# Patient Record
Sex: Female | Born: 1950
Health system: Southern US, Community
[De-identification: ages and names within clinical notes are randomized; demographics above are authoritative.]

## PROBLEM LIST (undated history)

## (undated) DIAGNOSIS — E785 Hyperlipidemia, unspecified: Secondary | ICD-10-CM

## (undated) DIAGNOSIS — N390 Urinary tract infection, site not specified: Secondary | ICD-10-CM

## (undated) DIAGNOSIS — F32A Depression, unspecified: Secondary | ICD-10-CM

## (undated) DIAGNOSIS — E039 Hypothyroidism, unspecified: Secondary | ICD-10-CM

## (undated) DIAGNOSIS — K219 Gastro-esophageal reflux disease without esophagitis: Secondary | ICD-10-CM

## (undated) HISTORY — DX: Hyperlipidemia, unspecified: E78.5

## (undated) HISTORY — PX: HEMORRHOID SURGERY: SHX153

## (undated) HISTORY — DX: Urinary tract infection, site not specified: N39.0

---

## 1998-10-20 ENCOUNTER — Other Ambulatory Visit: Admission: RE | Admit: 1998-10-20 | Discharge: 1998-10-20 | Payer: Self-pay | Admitting: Obstetrics and Gynecology

## 2000-08-13 ENCOUNTER — Other Ambulatory Visit: Admission: RE | Admit: 2000-08-13 | Discharge: 2000-08-13 | Payer: Self-pay | Admitting: Obstetrics and Gynecology

## 2002-06-05 HISTORY — PX: COLONOSCOPY: SHX174

## 2002-06-30 ENCOUNTER — Ambulatory Visit (HOSPITAL_COMMUNITY): Admission: RE | Admit: 2002-06-30 | Discharge: 2002-06-30 | Payer: Self-pay | Admitting: General Surgery

## 2003-09-02 ENCOUNTER — Ambulatory Visit (HOSPITAL_COMMUNITY): Admission: RE | Admit: 2003-09-02 | Discharge: 2003-09-02 | Payer: Self-pay | Admitting: Family Medicine

## 2004-10-09 ENCOUNTER — Ambulatory Visit (HOSPITAL_COMMUNITY): Admission: RE | Admit: 2004-10-09 | Discharge: 2004-10-09 | Payer: Self-pay | Admitting: Family Medicine

## 2005-05-13 ENCOUNTER — Ambulatory Visit (HOSPITAL_COMMUNITY): Admission: RE | Admit: 2005-05-13 | Discharge: 2005-05-13 | Payer: Self-pay | Admitting: Urology

## 2005-11-01 ENCOUNTER — Ambulatory Visit (HOSPITAL_COMMUNITY): Admission: RE | Admit: 2005-11-01 | Discharge: 2005-11-01 | Payer: Self-pay | Admitting: Family Medicine

## 2006-09-23 ENCOUNTER — Ambulatory Visit (HOSPITAL_COMMUNITY): Admission: RE | Admit: 2006-09-23 | Discharge: 2006-09-23 | Payer: Self-pay | Admitting: Family Medicine

## 2006-11-20 ENCOUNTER — Ambulatory Visit (HOSPITAL_COMMUNITY): Admission: RE | Admit: 2006-11-20 | Discharge: 2006-11-20 | Payer: Self-pay | Admitting: Family Medicine

## 2007-09-29 ENCOUNTER — Ambulatory Visit (HOSPITAL_COMMUNITY): Admission: RE | Admit: 2007-09-29 | Discharge: 2007-09-29 | Payer: Self-pay | Admitting: Family Medicine

## 2008-08-04 ENCOUNTER — Ambulatory Visit (HOSPITAL_COMMUNITY): Admission: RE | Admit: 2008-08-04 | Discharge: 2008-08-04 | Payer: Self-pay | Admitting: Family Medicine

## 2010-01-11 ENCOUNTER — Ambulatory Visit (HOSPITAL_COMMUNITY): Admission: RE | Admit: 2010-01-11 | Discharge: 2010-01-11 | Payer: Self-pay | Admitting: Family Medicine

## 2010-08-25 ENCOUNTER — Encounter: Payer: Self-pay | Admitting: Family Medicine

## 2010-08-26 ENCOUNTER — Encounter: Payer: Self-pay | Admitting: Family Medicine

## 2010-12-21 NOTE — H&P (Signed)
   April Cobb, UYENO NO.:  [id number]   MEDICAL RECORD NO.:  [id number]                  PATIENT TYPE:   LOCATION:                                       FACILITY:   PHYSICIAN:  Dalia Heading, M.D.               DATE OF BIRTH:  [birth year]   DATE OF ADMISSION:  DATE OF DISCHARGE:                                HISTORY & PHYSICAL   CHIEF COMPLAINT:  Hematochezia.   HISTORY OF PRESENT ILLNESS:  The patient is a 60 year old white female who  was referred for colonoscopy.  She has been having intermittent hematochezia  for many months.  She recently started having hemorrhoidal problems and  itching.  She has had a hemorrhoidectomy in the remote past.  She denies any  lightheadedness, weight loss, fever, abdominal pain, constipation, diarrhea,  or melena.  She has never had a colonoscopy.  She denies any immediate  family history of colon carcinoma.   PAST MEDICAL HISTORY:  Unremarkable.   PAST SURGICAL HISTORY:  Hemorrhoidectomy.   CURRENT MEDICATIONS:  1. Anusol suppositories.  2. Premarin vaginal cream.   ALLERGIES:  DARVOCET and CODEINE.   REVIEW OF SYMPTOMS:  The patient does smoke a half of a packs of cigarettes  a day.  She denies any significant alcohol use.  She denies any other  cardiopulmonary difficulty or bleeding disorders.   PHYSICAL EXAMINATION:  GENERAL APPEARANCE:  The patient is a well-developed,  well-nourished, white female in no acute distress.  VITAL SIGNS:  She is afebrile and vital signs are stable.  LUNGS:  Clear to auscultation with equal breath sounds bilaterally.  HEART:  Regular rate and rhythm without S3, S4, or murmurs.  ABDOMEN:  Soft, nontender, and nondistended.  No hepatosplenomegaly or  masses noted.  RECTAL:  Examination deferred to the procedure.   IMPRESSION:  Hematochezia.    PLAN:  The patient is scheduled for a colonoscopy on June 30, 2002.  The  risks and benefits of the procedure,  including bleeding and perforation were  full explained to the patient, who gave informed consent.                                               Dalia Heading, M.D.    MAJ/MEDQ  D:  06/24/2002  T:  06/24/2002  Job:  [id number]   cc:   Patrica Duel, M.D.  [address], [address]  [place]  [place] [address]  Fax: [phone]

## 2011-03-19 ENCOUNTER — Other Ambulatory Visit (HOSPITAL_COMMUNITY): Payer: Self-pay | Admitting: Family Medicine

## 2011-03-19 DIAGNOSIS — Z139 Encounter for screening, unspecified: Secondary | ICD-10-CM

## 2011-03-22 ENCOUNTER — Ambulatory Visit (HOSPITAL_COMMUNITY)
Admission: RE | Admit: 2011-03-22 | Discharge: 2011-03-22 | Disposition: A | Discharge status: Home / Self Care | Payer: BC Managed Care – PPO | Source: Ambulatory Visit | Attending: Family Medicine | Admitting: Family Medicine

## 2011-03-22 DIAGNOSIS — Z1231 Encounter for screening mammogram for malignant neoplasm of breast: Secondary | ICD-10-CM | POA: Insufficient documentation

## 2011-03-22 DIAGNOSIS — Z139 Encounter for screening, unspecified: Secondary | ICD-10-CM

## 2011-03-27 LAB — CBC WITH DIFFERENTIAL/PLATELET
HCT: 38 %
Hemoglobin: 12.4 g/dL (ref 12.0–16.0)
MCV: 90.3 fL
WBC: 6.4
platelet count: 299

## 2011-03-27 LAB — COMPREHENSIVE METABOLIC PANEL
ALT: 12 U/L (ref 7–35)
AST: 16 U/L
Albumin: 4.7
Alkaline Phosphatase: 65 U/L
BUN: 12 mg/dL (ref 4–21)
CO2: 25 mmol/L
Calcium: 9.4 mg/dL
Chloride: 106 mmol/L
Creat: 0.94
Glucose: 82 mg/dL
Potassium: 4.7 mmol/L
Sodium: 143 mmol/L (ref 137–147)
Total Bilirubin: 0.5 mg/dL
Total Protein ELP: 7

## 2011-03-27 LAB — TSH: TSH: 3.21 u[IU]/mL (ref 0.41–5.90)

## 2011-03-28 ENCOUNTER — Other Ambulatory Visit: Payer: Self-pay | Admitting: Family Medicine

## 2011-03-28 DIAGNOSIS — R928 Other abnormal and inconclusive findings on diagnostic imaging of breast: Secondary | ICD-10-CM

## 2011-04-16 ENCOUNTER — Ambulatory Visit (INDEPENDENT_AMBULATORY_CARE_PROVIDER_SITE_OTHER): Payer: BC Managed Care – PPO | Admitting: Urology

## 2011-04-16 ENCOUNTER — Other Ambulatory Visit: Payer: Self-pay | Admitting: Urology

## 2011-04-16 DIAGNOSIS — N3000 Acute cystitis without hematuria: Secondary | ICD-10-CM

## 2011-04-16 DIAGNOSIS — N952 Postmenopausal atrophic vaginitis: Secondary | ICD-10-CM

## 2011-04-16 DIAGNOSIS — F17200 Nicotine dependence, unspecified, uncomplicated: Secondary | ICD-10-CM

## 2011-04-16 DIAGNOSIS — R3129 Other microscopic hematuria: Secondary | ICD-10-CM

## 2011-04-17 ENCOUNTER — Other Ambulatory Visit (HOSPITAL_COMMUNITY): Payer: Self-pay | Admitting: Family Medicine

## 2011-04-17 ENCOUNTER — Ambulatory Visit (HOSPITAL_COMMUNITY)
Admission: RE | Admit: 2011-04-17 | Discharge: 2011-04-17 | Disposition: A | Discharge status: Home / Self Care | Payer: BC Managed Care – PPO | Source: Ambulatory Visit | Attending: Family Medicine | Admitting: Family Medicine

## 2011-04-17 DIAGNOSIS — R928 Other abnormal and inconclusive findings on diagnostic imaging of breast: Secondary | ICD-10-CM | POA: Insufficient documentation

## 2011-04-18 ENCOUNTER — Telehealth: Payer: Self-pay | Admitting: Gastroenterology

## 2011-04-18 ENCOUNTER — Ambulatory Visit (INDEPENDENT_AMBULATORY_CARE_PROVIDER_SITE_OTHER): Payer: BC Managed Care – PPO | Admitting: Gastroenterology

## 2011-04-18 ENCOUNTER — Encounter: Payer: Self-pay | Admitting: Gastroenterology

## 2011-04-18 VITALS — BP 110/65 | HR 72 | Temp 97.5°F | Ht 63.0 in | Wt 109.8 lb

## 2011-04-18 DIAGNOSIS — Z800 Family history of malignant neoplasm of digestive organs: Secondary | ICD-10-CM

## 2011-04-18 NOTE — Progress Notes (Signed)
Cc to PCP 

## 2011-04-18 NOTE — Telephone Encounter (Signed)
Pts time moved from 10:30 to 12- called pt and advised her of this, she will change her arrival to 11:00am

## 2011-04-18 NOTE — Progress Notes (Signed)
Primary Care Physician:  Colette Ribas, MD  Primary Gastroenterologist:  Jonette Eva, MD   Chief Complaint  Patient presents with  . Colonoscopy    HPI:  April Cobb is a 60 y.o. female here to schedule colonoscopy. Last one done in 2003 by Dr. Franky Macho. She has FH of multiple first degree relatives with precancerous polyps and sister diagnosed with CRC at age 76. No constipation, diarrhea, melena, brbpr, abdominal pain. No recent unintentional weight loss, lost some with while caring for her mother last year. Has gained five pounds back. Good appetite. Some occasional heartburn, takes Zantac prn. No dysphagia/odynophagia.   Current Outpatient Prescriptions  Medication Sig Dispense Refill  . Coenzyme Q10 (CO Q-10) 100 MG CAPS Take 100 mg by mouth 2 (two) times daily.          Allergies as of 04/18/2011 - Review Complete 04/18/2011  Allergen Reaction Noted  . Codeine Shortness Of Breath 04/18/2011  . Avelox (moxifloxacin hcl in nacl) Nausea And Vomiting 04/18/2011  . Darvocet (propoxyphene n-acetaminophen) Nausea And Vomiting 04/18/2011  . Lipitor (atorvastatin calcium)  04/18/2011  . Zithromax (azithromycin dihydrate) Nausea And Vomiting 04/18/2011  . Sulfa antibiotics Rash 04/18/2011    Past Medical History  Diagnosis Date  . Hyperlipidemia     did not tolerate lipitor  . UTI (lower urinary tract infection)     frequent, [facility]    Past Surgical History  Procedure Date  . Hemorrhoid surgery   . Colonoscopy 06/2002    Dr. Shaune Pollack internal hemorrhoids.     Family History  Problem Relation Age of Onset  . Colon polyps Mother     precancerous polyps  . Colon polyps Sister   . Colon cancer Sister 48  . Heart attack Father 55    deceased  . Heart attack Sister     deceased, also with "blood disease". alzhemiers  . Colon polyps Brother     precancerous polyps  . Diabetes Mother     deceased, alzheimers    History   Social History  .  Marital Status: Married    Spouse Name: N/A    Number of Children: 0  . Years of Education: N/A   Occupational History  . retired    Social History Main Topics  . Smoking status: Current Everyday Smoker -- 0.3 packs/day    Types: Cigarettes  . Smokeless tobacco: Not on file  . Alcohol Use: No  . Drug Use: No  . Sexually Active: Not on file   Other Topics Concern  . Not on file   Social History Narrative  . No narrative on file      ROS:  General: Negative for anorexia, weight loss, fever, chills, fatigue, weakness. Eyes: Negative for vision changes.  ENT: Negative for hoarseness, difficulty swallowing , nasal congestion. CV: Negative for chest pain, angina, palpitations, dyspnea on exertion, peripheral edema.  Respiratory: Negative for dyspnea at rest, dyspnea on exertion, cough, sputum, wheezing.  GI: See history of present illness. GU:  Negative for dysuria, hematuria, urinary incontinence, urinary frequency, nocturnal urination. H/O recurrent UTIs and microscopic hematuria. MS: Negative for joint pain, low back pain.  Derm: Negative for rash or itching.  Neuro: Negative for weakness, abnormal sensation, seizure, frequent headaches, memory loss, confusion.  Psych: Negative for anxiety, depression, suicidal ideation, hallucinations.  Endo: Negative for unusual weight change.  Heme: Negative for bruising or bleeding. Allergy: Negative for rash or hives.    Physical Examination:  BP 110/65  Pulse 72  Temp(Src) 97.5 F (36.4 C) (Temporal)  Ht 5\' 3"  (1.6 m)  Wt 109 lb 12.8 oz (49.805 kg)  BMI 19.45 kg/m2   General: Well-nourished, well-developed in no acute distress.  Head: Normocephalic, atraumatic.   Eyes: Conjunctiva pink, no icterus. Mouth: Oropharyngeal mucosa moist and pink , no lesions erythema or exudate. Neck: Supple without thyromegaly, masses, or lymphadenopathy.  Lungs: Clear to auscultation bilaterally.  Heart: Regular rate and rhythm, no murmurs  rubs or gallops.  Abdomen: Bowel sounds are normal, nontender, nondistended, no hepatosplenomegaly or masses, no abdominal bruits or    hernia , no rebound or guarding.   Rectal: Defer to time of TCS. Extremities: No lower extremity edema. No clubbing or deformities.  Neuro: Alert and oriented x 4 , grossly normal neurologically.  Skin: Warm and dry, no rash or jaundice.   Psych: Alert and cooperative, normal mood and affect.  Labs: Labs 03/27/11: WBC 6400, H/H 12.4/38.2, Plt 299,000, TSH 3.207, Na 143, K 4.7, BUN12, Cre 0.94, Tbili 0.5, AP 65, AST 16, ALT 12, alb 4.7, Calcium 9.4.

## 2011-04-18 NOTE — Assessment & Plan Note (Addendum)
Due for colonoscopy due to FH of CRC in sibling at age 60. Last TCS 9 years ago.  I have discussed the risks, alternatives, benefits with regards to but not limited to the risk of reaction to medication, bleeding, infection, perforation and the patient is agreeable to proceed. Written consent to be obtained.  Patient c/o n/v post-anesthesia. Consider anti-emetics prior to sedation.

## 2011-04-19 ENCOUNTER — Ambulatory Visit (HOSPITAL_COMMUNITY)
Admission: RE | Admit: 2011-04-19 | Discharge: 2011-04-19 | Disposition: A | Discharge status: Home / Self Care | Payer: BC Managed Care – PPO | Source: Ambulatory Visit | Attending: Urology | Admitting: Urology

## 2011-04-19 DIAGNOSIS — R9389 Abnormal findings on diagnostic imaging of other specified body structures: Secondary | ICD-10-CM | POA: Insufficient documentation

## 2011-04-19 DIAGNOSIS — N3000 Acute cystitis without hematuria: Secondary | ICD-10-CM | POA: Insufficient documentation

## 2011-04-24 NOTE — Progress Notes (Signed)
Agree. Will give phenergan/demerol.versed for initial dose. TCS Q5 YEARS.

## 2011-05-02 ENCOUNTER — Telehealth: Payer: Self-pay | Admitting: Gastroenterology

## 2011-05-02 MED ORDER — SODIUM CHLORIDE 0.45 % IV SOLN
Freq: Once | INTRAVENOUS | Status: AC
  Administered 2011-05-03: 14:00:00 via INTRAVENOUS

## 2011-05-02 NOTE — Telephone Encounter (Signed)
Pts time moved due to add on case- she is aware of the change and ok with it

## 2011-05-03 ENCOUNTER — Other Ambulatory Visit: Payer: Self-pay | Admitting: Gastroenterology

## 2011-05-03 ENCOUNTER — Ambulatory Visit (HOSPITAL_COMMUNITY)
Admission: RE | Admit: 2011-05-03 | Discharge: 2011-05-03 | Disposition: A | Discharge status: Home / Self Care | Payer: BC Managed Care – PPO | Source: Ambulatory Visit | Attending: Gastroenterology | Admitting: Gastroenterology

## 2011-05-03 ENCOUNTER — Encounter (HOSPITAL_COMMUNITY)
Admission: RE | Disposition: A | Discharge status: Home / Self Care | Payer: Self-pay | Source: Ambulatory Visit | Attending: Gastroenterology

## 2011-05-03 DIAGNOSIS — Z85038 Personal history of other malignant neoplasm of large intestine: Secondary | ICD-10-CM | POA: Insufficient documentation

## 2011-05-03 DIAGNOSIS — Z860100 Personal history of colon polyps, unspecified: Secondary | ICD-10-CM | POA: Insufficient documentation

## 2011-05-03 DIAGNOSIS — Z83719 Family history of colon polyps, unspecified: Secondary | ICD-10-CM | POA: Insufficient documentation

## 2011-05-03 DIAGNOSIS — Z800 Family history of malignant neoplasm of digestive organs: Secondary | ICD-10-CM

## 2011-05-03 DIAGNOSIS — Z8371 Family history of colonic polyps: Secondary | ICD-10-CM | POA: Insufficient documentation

## 2011-05-03 DIAGNOSIS — K648 Other hemorrhoids: Secondary | ICD-10-CM | POA: Insufficient documentation

## 2011-05-03 DIAGNOSIS — E785 Hyperlipidemia, unspecified: Secondary | ICD-10-CM | POA: Insufficient documentation

## 2011-05-03 DIAGNOSIS — D126 Benign neoplasm of colon, unspecified: Secondary | ICD-10-CM | POA: Insufficient documentation

## 2011-05-03 DIAGNOSIS — Z1211 Encounter for screening for malignant neoplasm of colon: Secondary | ICD-10-CM

## 2011-05-03 HISTORY — PX: COLONOSCOPY: SHX5424

## 2011-05-03 SURGERY — COLONOSCOPY
Anesthesia: Moderate Sedation

## 2011-05-03 MED ORDER — MEPERIDINE HCL 100 MG/ML IJ SOLN
INTRAMUSCULAR | Status: AC
  Filled 2011-05-03: qty 1

## 2011-05-03 MED ORDER — MEPERIDINE HCL 100 MG/ML IJ SOLN
INTRAMUSCULAR | Status: DC | PRN
  Administered 2011-05-03 (×2): 25 mg

## 2011-05-03 MED ORDER — MIDAZOLAM HCL 5 MG/5ML IJ SOLN
INTRAMUSCULAR | Status: DC | PRN
  Administered 2011-05-03: 2 mg via INTRAVENOUS
  Administered 2011-05-03 (×2): 1 mg via INTRAVENOUS

## 2011-05-03 MED ORDER — MIDAZOLAM HCL 5 MG/5ML IJ SOLN
INTRAMUSCULAR | Status: AC
  Filled 2011-05-03: qty 10

## 2011-05-03 NOTE — Interval H&P Note (Signed)
History and Physical Interval Note:   05/03/2011   2:36 PM   April Cobb  has presented today for surgery, with the diagnosis of FH of CRC  The various methods of treatment have been discussed with the patient and family. After consideration of risks, benefits and other options for treatment, the patient has consented to  Procedure(s): COLONOSCOPY as a surgical intervention .  I have reviewed the patients' chart and labs.  Questions were answered to the patient's satisfaction.     Jonette Eva  MD

## 2011-05-03 NOTE — H&P (Signed)
Reason for Visit     Colonoscopy        Vitals - Last Recorded       BP Pulse Temp(Src) Ht Wt BMI    110/65  72  97.5 F (36.4 C) (Temporal)  5\' 3"  (1.6 m)  109 lb 12.8 oz (49.805 kg)  19.45 kg/m2          Progress Notes     Tana Coast, PA  04/18/2011  4:27 PM  Signed Primary Care Physician:  Colette Ribas, MD   Primary Gastroenterologist:  Jonette Eva, MD      Chief Complaint   Patient presents with   .  Colonoscopy      HPI:  April Cobb is a 60 y.o. female here to schedule colonoscopy. Last one done in 2003 by Dr. Franky Macho. She has FH of multiple first degree relatives with precancerous polyps and sister diagnosed with CRC at age 73. No constipation, diarrhea, melena, brbpr, abdominal pain. No recent unintentional weight loss, lost some with while caring for her mother last year. Has gained five pounds back. Good appetite. Some occasional heartburn, takes Zantac prn. No dysphagia/odynophagia.     Current Outpatient Prescriptions   Medication  Sig  Dispense  Refill   .  Coenzyme Q10 (CO Q-10) 100 MG CAPS  Take 100 mg by mouth 2 (two) times daily.               Allergies as of 04/18/2011 - Review Complete 04/18/2011   Allergen  Reaction  Noted   .  Codeine  Shortness Of Breath  04/18/2011   .  Avelox (moxifloxacin hcl in nacl)  Nausea And Vomiting  04/18/2011   .  Darvocet (propoxyphene n-acetaminophen)  Nausea And Vomiting  04/18/2011   .  Lipitor (atorvastatin calcium)    04/18/2011   .  Zithromax (azithromycin dihydrate)  Nausea And Vomiting  04/18/2011   .  Sulfa antibiotics  Rash  04/18/2011       Past Medical History   Diagnosis  Date   .  Hyperlipidemia         did not tolerate lipitor   .  UTI (lower urinary tract infection)         frequent, [facility]       Past Surgical History   Procedure  Date   .  Hemorrhoid surgery     .  Colonoscopy  06/2002       Dr. Shaune Pollack internal hemorrhoids.        Family History     Problem  Relation  Age of Onset   .  Colon polyps  Mother         precancerous polyps   .  Colon polyps  Sister     .  Colon cancer  Sister  59   .  Heart attack  Father  20       deceased   .  Heart attack  Sister         deceased, also with "blood disease". alzhemiers   .  Colon polyps  Brother         precancerous polyps   .  Diabetes  Mother         deceased, alzheimers       History       Social History   .  Marital Status:  Married       Spouse Name:  N/A  Number of Children:  0   .  Years of Education:  N/A       Occupational History   .  retired         Social History Main Topics   .  Smoking status:  Current Everyday Smoker -- 0.3 packs/day       Types:  Cigarettes   .  Smokeless tobacco:  Not on file   .  Alcohol Use:  No   .  Drug Use:  No   .  Sexually Active:  Not on file       Other Topics  Concern   .  Not on file       Social History Narrative   .  No narrative on file        ROS:   General: Negative for anorexia, weight loss, fever, chills, fatigue, weakness. Eyes: Negative for vision changes.   ENT: Negative for hoarseness, difficulty swallowing , nasal congestion. CV: Negative for chest pain, angina, palpitations, dyspnea on exertion, peripheral edema.   Respiratory: Negative for dyspnea at rest, dyspnea on exertion, cough, sputum, wheezing.   GI: See history of present illness. GU:  Negative for dysuria, hematuria, urinary incontinence, urinary frequency, nocturnal urination. H/O recurrent UTIs and microscopic hematuria. MS: Negative for joint pain, low back pain.   Derm: Negative for rash or itching.   Neuro: Negative for weakness, abnormal sensation, seizure, frequent headaches, memory loss, confusion.   Psych: Negative for anxiety, depression, suicidal ideation, hallucinations.   Endo: Negative for unusual weight change.   Heme: Negative for bruising or bleeding. Allergy: Negative for rash or hives.     Physical  Examination:   BP 110/65  Pulse 72  Temp(Src) 97.5 F (36.4 C) (Temporal)  Ht 5\' 3"  (1.6 m)  Wt 109 lb 12.8 oz (49.805 kg)  BMI 19.45 kg/m2    General: Well-nourished, well-developed in no acute distress.   Head: Normocephalic, atraumatic.    Eyes: Conjunctiva pink, no icterus. Mouth: Oropharyngeal mucosa moist and pink , no lesions erythema or exudate. Neck: Supple without thyromegaly, masses, or lymphadenopathy.   Lungs: Clear to auscultation bilaterally.   Heart: Regular rate and rhythm, no murmurs rubs or gallops.   Abdomen: Bowel sounds are normal, nontender, nondistended, no hepatosplenomegaly or masses, no abdominal bruits or    hernia , no rebound or guarding.    Rectal: Defer to time of TCS. Extremities: No lower extremity edema. No clubbing or deformities.   Neuro: Alert and oriented x 4 , grossly normal neurologically.   Skin: Warm and dry, no rash or jaundice.    Psych: Alert and cooperative, normal mood and affect.   Labs: Labs 03/27/11: WBC 6400, H/H 12.4/38.2, Plt 299,000, TSH 3.207, Na 143, K 4.7, BUN12, Cre 0.94, Tbili 0.5, AP 65, AST 16, ALT 12, alb 4.7, Calcium 9.4.    [place] Score  04/18/2011 11:11 AM  Signed Cc to PCP  [place] Score  04/18/2011  4:42 PM  Signed Cc to PCP  Jonette Eva, MD  04/24/2011 10:50 AM  Signed Agree. Will give phenergan/demerol.versed for initial dose. TCS Q5 YEARS.        FH: colon cancer - Tana Coast, PA  04/18/2011 11:11 AM  Addendum Due for colonoscopy due to FH of CRC in sibling at age 56. Last TCS 9 years ago.  I have discussed the risks, alternatives, benefits with regards to but not limited to the risk of reaction to medication, bleeding, infection,  perforation and the patient is agreeable to proceed. Written consent to be obtained.   Patient c/o n/v post-anesthesia. Consider anti-emetics prior to sedation.

## 2011-05-09 ENCOUNTER — Telehealth: Payer: Self-pay | Admitting: Gastroenterology

## 2011-05-09 NOTE — Telephone Encounter (Signed)
Please call pt. She had simple adenomas removed from her colon. TCS in 5 years. High fiber diet.

## 2011-05-09 NOTE — Telephone Encounter (Signed)
Results Cc to PCP an 5 yr reminder is nicd in the computer 

## 2011-05-09 NOTE — Telephone Encounter (Signed)
Pt informed

## 2011-05-13 ENCOUNTER — Encounter (HOSPITAL_COMMUNITY): Payer: Self-pay | Admitting: Gastroenterology

## 2011-06-11 ENCOUNTER — Ambulatory Visit: Payer: BC Managed Care – PPO | Admitting: Urology

## 2012-04-20 ENCOUNTER — Other Ambulatory Visit (HOSPITAL_COMMUNITY): Payer: Self-pay | Admitting: Family Medicine

## 2012-04-20 DIAGNOSIS — Z139 Encounter for screening, unspecified: Secondary | ICD-10-CM

## 2012-04-24 ENCOUNTER — Ambulatory Visit (HOSPITAL_COMMUNITY)
Admission: RE | Admit: 2012-04-24 | Discharge: 2012-04-24 | Disposition: A | Discharge status: Home / Self Care | Payer: BC Managed Care – PPO | Source: Ambulatory Visit | Attending: Family Medicine | Admitting: Family Medicine

## 2012-04-24 DIAGNOSIS — Z139 Encounter for screening, unspecified: Secondary | ICD-10-CM

## 2012-04-24 DIAGNOSIS — Z1231 Encounter for screening mammogram for malignant neoplasm of breast: Secondary | ICD-10-CM | POA: Insufficient documentation

## 2013-05-03 ENCOUNTER — Other Ambulatory Visit (HOSPITAL_COMMUNITY): Payer: Self-pay | Admitting: Family Medicine

## 2013-05-03 DIAGNOSIS — Z139 Encounter for screening, unspecified: Secondary | ICD-10-CM

## 2013-05-06 ENCOUNTER — Ambulatory Visit (HOSPITAL_COMMUNITY)
Admission: RE | Admit: 2013-05-06 | Discharge: 2013-05-06 | Disposition: A | Discharge status: Home / Self Care | Payer: BC Managed Care – PPO | Source: Ambulatory Visit | Attending: Family Medicine | Admitting: Family Medicine

## 2013-05-06 DIAGNOSIS — Z139 Encounter for screening, unspecified: Secondary | ICD-10-CM

## 2013-05-06 DIAGNOSIS — Z1231 Encounter for screening mammogram for malignant neoplasm of breast: Secondary | ICD-10-CM | POA: Insufficient documentation

## 2013-05-10 ENCOUNTER — Other Ambulatory Visit (HOSPITAL_COMMUNITY): Payer: Self-pay | Admitting: Family Medicine

## 2013-05-10 ENCOUNTER — Ambulatory Visit (HOSPITAL_COMMUNITY)
Admission: RE | Admit: 2013-05-10 | Discharge: 2013-05-10 | Disposition: A | Discharge status: Home / Self Care | Payer: BC Managed Care – PPO | Source: Ambulatory Visit | Attending: Family Medicine | Admitting: Family Medicine

## 2013-05-10 DIAGNOSIS — J392 Other diseases of pharynx: Secondary | ICD-10-CM

## 2013-05-10 DIAGNOSIS — R059 Cough, unspecified: Secondary | ICD-10-CM | POA: Insufficient documentation

## 2013-05-10 DIAGNOSIS — R05 Cough: Secondary | ICD-10-CM | POA: Insufficient documentation

## 2013-05-10 DIAGNOSIS — R222 Localized swelling, mass and lump, trunk: Secondary | ICD-10-CM | POA: Insufficient documentation

## 2014-03-17 ENCOUNTER — Ambulatory Visit (INDEPENDENT_AMBULATORY_CARE_PROVIDER_SITE_OTHER): Payer: Medicare HMO | Admitting: Otolaryngology

## 2014-03-17 DIAGNOSIS — H6980 Other specified disorders of Eustachian tube, unspecified ear: Secondary | ICD-10-CM

## 2014-03-17 DIAGNOSIS — H903 Sensorineural hearing loss, bilateral: Secondary | ICD-10-CM

## 2015-04-03 ENCOUNTER — Other Ambulatory Visit (HOSPITAL_COMMUNITY): Payer: Self-pay | Admitting: Family Medicine

## 2015-04-03 DIAGNOSIS — Z1231 Encounter for screening mammogram for malignant neoplasm of breast: Secondary | ICD-10-CM

## 2015-04-06 ENCOUNTER — Ambulatory Visit (HOSPITAL_COMMUNITY)
Admission: RE | Admit: 2015-04-06 | Discharge: 2015-04-06 | Disposition: A | Discharge status: Home / Self Care | Payer: Medicare HMO | Source: Ambulatory Visit | Attending: Family Medicine | Admitting: Family Medicine

## 2015-04-06 DIAGNOSIS — Z1231 Encounter for screening mammogram for malignant neoplasm of breast: Secondary | ICD-10-CM | POA: Diagnosis present

## 2015-04-11 ENCOUNTER — Other Ambulatory Visit: Payer: Self-pay | Admitting: Family Medicine

## 2015-04-11 DIAGNOSIS — R928 Other abnormal and inconclusive findings on diagnostic imaging of breast: Secondary | ICD-10-CM

## 2015-04-18 ENCOUNTER — Ambulatory Visit (HOSPITAL_COMMUNITY)
Admission: RE | Admit: 2015-04-18 | Discharge: 2015-04-18 | Disposition: A | Discharge status: Home / Self Care | Payer: Medicare HMO | Source: Ambulatory Visit | Attending: Family Medicine | Admitting: Family Medicine

## 2015-04-18 DIAGNOSIS — R921 Mammographic calcification found on diagnostic imaging of breast: Secondary | ICD-10-CM | POA: Diagnosis present

## 2015-04-18 DIAGNOSIS — R928 Other abnormal and inconclusive findings on diagnostic imaging of breast: Secondary | ICD-10-CM

## 2015-04-27 ENCOUNTER — Other Ambulatory Visit (HOSPITAL_COMMUNITY): Payer: Self-pay | Admitting: Family Medicine

## 2015-04-27 DIAGNOSIS — M810 Age-related osteoporosis without current pathological fracture: Secondary | ICD-10-CM

## 2015-05-11 ENCOUNTER — Ambulatory Visit (HOSPITAL_COMMUNITY)
Admission: RE | Admit: 2015-05-11 | Discharge: 2015-05-11 | Disposition: A | Discharge status: Home / Self Care | Payer: Medicare HMO | Source: Ambulatory Visit | Attending: Family Medicine | Admitting: Family Medicine

## 2015-05-11 DIAGNOSIS — M810 Age-related osteoporosis without current pathological fracture: Secondary | ICD-10-CM | POA: Diagnosis not present

## 2015-05-11 DIAGNOSIS — Z780 Asymptomatic menopausal state: Secondary | ICD-10-CM | POA: Insufficient documentation

## 2015-05-11 DIAGNOSIS — M8589 Other specified disorders of bone density and structure, multiple sites: Secondary | ICD-10-CM | POA: Diagnosis not present

## 2015-06-06 DIAGNOSIS — E039 Hypothyroidism, unspecified: Secondary | ICD-10-CM | POA: Diagnosis not present

## 2015-06-06 DIAGNOSIS — Z6822 Body mass index (BMI) 22.0-22.9, adult: Secondary | ICD-10-CM | POA: Diagnosis not present

## 2015-06-06 DIAGNOSIS — M810 Age-related osteoporosis without current pathological fracture: Secondary | ICD-10-CM | POA: Diagnosis not present

## 2015-06-06 DIAGNOSIS — Z1389 Encounter for screening for other disorder: Secondary | ICD-10-CM | POA: Diagnosis not present

## 2015-06-06 DIAGNOSIS — E785 Hyperlipidemia, unspecified: Secondary | ICD-10-CM | POA: Diagnosis not present

## 2015-10-25 DIAGNOSIS — R69 Illness, unspecified: Secondary | ICD-10-CM | POA: Diagnosis not present

## 2016-01-25 DIAGNOSIS — Z681 Body mass index (BMI) 19 or less, adult: Secondary | ICD-10-CM | POA: Diagnosis not present

## 2016-01-25 DIAGNOSIS — Z0000 Encounter for general adult medical examination without abnormal findings: Secondary | ICD-10-CM | POA: Diagnosis not present

## 2016-01-25 DIAGNOSIS — K219 Gastro-esophageal reflux disease without esophagitis: Secondary | ICD-10-CM | POA: Diagnosis not present

## 2016-01-25 DIAGNOSIS — E039 Hypothyroidism, unspecified: Secondary | ICD-10-CM | POA: Diagnosis not present

## 2016-01-26 DIAGNOSIS — E782 Mixed hyperlipidemia: Secondary | ICD-10-CM | POA: Diagnosis not present

## 2016-01-26 DIAGNOSIS — Z0000 Encounter for general adult medical examination without abnormal findings: Secondary | ICD-10-CM | POA: Diagnosis not present

## 2016-03-05 ENCOUNTER — Encounter: Payer: Self-pay | Admitting: Gastroenterology

## 2016-04-22 DIAGNOSIS — R69 Illness, unspecified: Secondary | ICD-10-CM | POA: Diagnosis not present

## 2016-08-06 ENCOUNTER — Other Ambulatory Visit (HOSPITAL_COMMUNITY): Payer: Self-pay | Admitting: Family Medicine

## 2016-08-06 DIAGNOSIS — Z1231 Encounter for screening mammogram for malignant neoplasm of breast: Secondary | ICD-10-CM

## 2016-08-07 DIAGNOSIS — E039 Hypothyroidism, unspecified: Secondary | ICD-10-CM | POA: Diagnosis not present

## 2016-08-07 DIAGNOSIS — Z6822 Body mass index (BMI) 22.0-22.9, adult: Secondary | ICD-10-CM | POA: Diagnosis not present

## 2016-08-07 DIAGNOSIS — Z1389 Encounter for screening for other disorder: Secondary | ICD-10-CM | POA: Diagnosis not present

## 2016-08-07 DIAGNOSIS — E782 Mixed hyperlipidemia: Secondary | ICD-10-CM | POA: Diagnosis not present

## 2016-08-14 ENCOUNTER — Ambulatory Visit (HOSPITAL_COMMUNITY): Payer: Medicare HMO

## 2016-08-15 ENCOUNTER — Ambulatory Visit (HOSPITAL_COMMUNITY)
Admission: RE | Admit: 2016-08-15 | Discharge: 2016-08-15 | Disposition: A | Discharge status: Home / Self Care | Payer: Medicare HMO | Source: Ambulatory Visit | Attending: Family Medicine | Admitting: Family Medicine

## 2016-08-15 DIAGNOSIS — Z1231 Encounter for screening mammogram for malignant neoplasm of breast: Secondary | ICD-10-CM | POA: Insufficient documentation

## 2016-12-02 DIAGNOSIS — R69 Illness, unspecified: Secondary | ICD-10-CM | POA: Diagnosis not present

## 2017-03-18 DIAGNOSIS — L570 Actinic keratosis: Secondary | ICD-10-CM | POA: Diagnosis not present

## 2017-03-18 DIAGNOSIS — E039 Hypothyroidism, unspecified: Secondary | ICD-10-CM | POA: Diagnosis not present

## 2017-03-18 DIAGNOSIS — Z1389 Encounter for screening for other disorder: Secondary | ICD-10-CM | POA: Diagnosis not present

## 2017-03-18 DIAGNOSIS — Z6823 Body mass index (BMI) 23.0-23.9, adult: Secondary | ICD-10-CM | POA: Diagnosis not present

## 2017-03-18 DIAGNOSIS — E782 Mixed hyperlipidemia: Secondary | ICD-10-CM | POA: Diagnosis not present

## 2017-03-18 DIAGNOSIS — N359 Urethral stricture, unspecified: Secondary | ICD-10-CM | POA: Diagnosis not present

## 2017-03-18 DIAGNOSIS — Z0001 Encounter for general adult medical examination with abnormal findings: Secondary | ICD-10-CM | POA: Diagnosis not present

## 2017-03-24 ENCOUNTER — Encounter (INDEPENDENT_AMBULATORY_CARE_PROVIDER_SITE_OTHER): Payer: Self-pay | Admitting: *Deleted

## 2017-06-10 DIAGNOSIS — R69 Illness, unspecified: Secondary | ICD-10-CM | POA: Diagnosis not present

## 2017-07-02 ENCOUNTER — Other Ambulatory Visit (INDEPENDENT_AMBULATORY_CARE_PROVIDER_SITE_OTHER): Payer: Self-pay | Admitting: *Deleted

## 2017-07-02 DIAGNOSIS — Z860100 Personal history of colon polyps, unspecified: Secondary | ICD-10-CM | POA: Insufficient documentation

## 2017-07-02 DIAGNOSIS — Z800 Family history of malignant neoplasm of digestive organs: Secondary | ICD-10-CM

## 2017-08-13 DIAGNOSIS — E782 Mixed hyperlipidemia: Secondary | ICD-10-CM | POA: Diagnosis not present

## 2017-08-13 DIAGNOSIS — E039 Hypothyroidism, unspecified: Secondary | ICD-10-CM | POA: Diagnosis not present

## 2017-08-13 DIAGNOSIS — Z6822 Body mass index (BMI) 22.0-22.9, adult: Secondary | ICD-10-CM | POA: Diagnosis not present

## 2017-08-13 DIAGNOSIS — R69 Illness, unspecified: Secondary | ICD-10-CM | POA: Diagnosis not present

## 2017-08-13 DIAGNOSIS — Z1389 Encounter for screening for other disorder: Secondary | ICD-10-CM | POA: Diagnosis not present

## 2017-09-12 ENCOUNTER — Telehealth (INDEPENDENT_AMBULATORY_CARE_PROVIDER_SITE_OTHER): Payer: Self-pay | Admitting: *Deleted

## 2017-09-12 ENCOUNTER — Encounter (INDEPENDENT_AMBULATORY_CARE_PROVIDER_SITE_OTHER): Payer: Self-pay | Admitting: *Deleted

## 2017-09-12 NOTE — Telephone Encounter (Signed)
Patient needs trilyte 

## 2017-09-15 MED ORDER — PEG 3350-KCL-NA BICARB-NACL 420 G PO SOLR: 4000.0000 mL | Freq: Once | ORAL | 0 refills | Status: AC

## 2017-09-25 ENCOUNTER — Telehealth (INDEPENDENT_AMBULATORY_CARE_PROVIDER_SITE_OTHER): Payer: Self-pay | Admitting: *Deleted

## 2017-09-25 NOTE — Telephone Encounter (Signed)
Referring MD/PCP: [place]   Procedure: tcs  Reason/Indication:  Hx polyps, fam hx colon ca  Has patient had this procedure before?  Yes, 2012  If so, when, by whom and where?    Is there a family history of colon cancer?  Yes, sister  Who?  What age when diagnosed?    Is patient diabetic?   no      Does patient have prosthetic heart valve or mechanical valve?  no  Do you have a pacemaker?  no  Has patient ever had endocarditis? no  Has patient had joint replacement within last 12 months?  no  Is patient constipated or do they take laxatives? no  Does patient have a history of alcohol/drug use?  no  Is patient on blood thinner such as Coumadin, Plavix and/or Aspirin? no  Medications: lovastatin 10 mg daily, levothyroxine 25 mg daily  Allergies: see epic  Medication Adjustment per Dr Lindi Adie, NP:   Procedure date & time: 10/16/17 at 830

## 2017-09-25 NOTE — Telephone Encounter (Signed)
agree

## 2017-10-16 ENCOUNTER — Ambulatory Visit (HOSPITAL_COMMUNITY)
Admission: RE | Admit: 2017-10-16 | Discharge: 2017-10-16 | Disposition: A | Discharge status: Home / Self Care | Payer: Medicare HMO | Source: Ambulatory Visit | Attending: Internal Medicine | Admitting: Internal Medicine

## 2017-10-16 ENCOUNTER — Encounter (HOSPITAL_COMMUNITY): Payer: Self-pay | Admitting: *Deleted

## 2017-10-16 ENCOUNTER — Encounter (HOSPITAL_COMMUNITY)
Admission: RE | Disposition: A | Discharge status: Home / Self Care | Payer: Self-pay | Source: Ambulatory Visit | Attending: Internal Medicine

## 2017-10-16 ENCOUNTER — Other Ambulatory Visit: Payer: Self-pay

## 2017-10-16 DIAGNOSIS — Z860100 Personal history of colon polyps, unspecified: Secondary | ICD-10-CM | POA: Insufficient documentation

## 2017-10-16 DIAGNOSIS — Z09 Encounter for follow-up examination after completed treatment for conditions other than malignant neoplasm: Secondary | ICD-10-CM | POA: Diagnosis not present

## 2017-10-16 DIAGNOSIS — Z800 Family history of malignant neoplasm of digestive organs: Secondary | ICD-10-CM | POA: Diagnosis not present

## 2017-10-16 DIAGNOSIS — Z1211 Encounter for screening for malignant neoplasm of colon: Secondary | ICD-10-CM | POA: Diagnosis present

## 2017-10-16 DIAGNOSIS — Z79899 Other long term (current) drug therapy: Secondary | ICD-10-CM | POA: Insufficient documentation

## 2017-10-16 DIAGNOSIS — K644 Residual hemorrhoidal skin tags: Secondary | ICD-10-CM | POA: Insufficient documentation

## 2017-10-16 DIAGNOSIS — E039 Hypothyroidism, unspecified: Secondary | ICD-10-CM | POA: Diagnosis not present

## 2017-10-16 DIAGNOSIS — E785 Hyperlipidemia, unspecified: Secondary | ICD-10-CM | POA: Insufficient documentation

## 2017-10-16 DIAGNOSIS — Z87891 Personal history of nicotine dependence: Secondary | ICD-10-CM | POA: Diagnosis not present

## 2017-10-16 DIAGNOSIS — Z791 Long term (current) use of non-steroidal anti-inflammatories (NSAID): Secondary | ICD-10-CM | POA: Diagnosis not present

## 2017-10-16 HISTORY — PX: COLONOSCOPY: SHX5424

## 2017-10-16 HISTORY — DX: Hypothyroidism, unspecified: E03.9

## 2017-10-16 SURGERY — COLONOSCOPY
Anesthesia: Moderate Sedation

## 2017-10-16 MED ORDER — MEPERIDINE HCL 50 MG/ML IJ SOLN
INTRAMUSCULAR | Status: AC
  Filled 2017-10-16: qty 1

## 2017-10-16 MED ORDER — MIDAZOLAM HCL 5 MG/5ML IJ SOLN
INTRAMUSCULAR | Status: DC | PRN
  Administered 2017-10-16: 2 mg via INTRAVENOUS
  Administered 2017-10-16 (×3): 1 mg via INTRAVENOUS

## 2017-10-16 MED ORDER — MEPERIDINE HCL 50 MG/ML IJ SOLN
INTRAMUSCULAR | Status: DC | PRN
  Administered 2017-10-16 (×2): 25 mg via INTRAVENOUS

## 2017-10-16 MED ORDER — SODIUM CHLORIDE 0.9 % IV SOLN
INTRAVENOUS | Status: DC
  Administered 2017-10-16: 1000 mL via INTRAVENOUS

## 2017-10-16 MED ORDER — STERILE WATER FOR IRRIGATION IR SOLN
Status: DC | PRN
  Administered 2017-10-16: 15 mL

## 2017-10-16 MED ORDER — MIDAZOLAM HCL 5 MG/5ML IJ SOLN
INTRAMUSCULAR | Status: AC
  Filled 2017-10-16: qty 10

## 2017-10-16 NOTE — H&P (Signed)
April Cobb is an 67 y.o. female.   Chief Complaint: Patient is here for colonoscopy. HPI: She is 67 year old Caucasian female who has history of colonic adenoma and is here for surveillance colonoscopy.  Last exam was in September 2012 with removal of adenoma from descending colon.  She denies abdominal pain change in bowel habits or rectal bleeding. Family history significant for CRC and a sister who was 94 at the time of diagnosis and is doing 5 years later. Another sister has been treated for gastric lymphoma and is in remission.  Past Medical History:  Diagnosis Date  . Hyperlipidemia    did not tolerate lipitor  . Hypothyroidism   . UTI (lower urinary tract infection)    frequent, [facility]    Past Surgical History:  Procedure Laterality Date  . COLONOSCOPY  06/2002   Dr. Tamera Punt internal hemorrhoids.   . COLONOSCOPY  05/03/2011   Procedure: COLONOSCOPY;  Surgeon: Dorothyann Peng, MD;  Location: [facility];  Service: Endoscopy;  Laterality: N/A;  9:30  . HEMORRHOID SURGERY      Family History  Problem Relation Age of Onset  . Colon polyps Mother        precancerous polyps  . Diabetes Mother        deceased, alzheimers  . Colon polyps Sister   . Colon cancer Sister 53  . Heart attack Father 31       deceased  . Heart attack Sister        deceased, also with "blood disease". alzhemiers  . Colon polyps Brother        precancerous polyps   Social History:  reports that she has quit smoking. Her smoking use included cigarettes. She smoked 0.30 packs per day. she has never used smokeless tobacco. She reports that she does not drink alcohol or use drugs.  Allergies:  Allergies  Allergen Reactions  . Codeine Shortness Of Breath  . Avelox [Moxifloxacin Hcl In Nacl] Nausea And Vomiting  . Darvocet [Propoxyphene N-Acetaminophen] Nausea And Vomiting  . Lipitor [Atorvastatin Calcium]     Leg pain   . Zithromax [Azithromycin Dihydrate] Nausea And Vomiting  .  Sulfa Antibiotics Rash    Medications Prior to Admission  Medication Sig Dispense Refill  . ibuprofen (ADVIL,MOTRIN) 200 MG tablet Take 200 mg by mouth every 6 (six) hours as needed.    Marland Kitchen levothyroxine (SYNTHROID, LEVOTHROID) 25 MCG tablet Take 25 mcg by mouth daily before breakfast.    . lovastatin (MEVACOR) 10 MG tablet Take 10 mg by mouth at bedtime.    Marland Kitchen acetaminophen (TYLENOL) 500 MG tablet Take 500 mg by mouth every 6 (six) hours as needed. For pain       No results found for this or any previous visit (from the past 48 hour(s)). No results found.  ROS  Blood pressure 108/65, pulse 62, temperature 97.9 F (36.6 C), temperature source Oral, resp. rate 16, height 5\' 2"  (1.575 m), weight 124 lb (56.2 kg), SpO2 100 %. Physical Exam  Constitutional: She appears well-developed and well-nourished.  HENT:  Mouth/Throat: Oropharynx is clear and moist.  Eyes: Conjunctivae are normal. No scleral icterus.  Neck: No thyromegaly present.  Cardiovascular: Normal rate, regular rhythm and normal heart sounds.  No murmur heard. Respiratory: Effort normal and breath sounds normal.  GI: Soft. She exhibits no distension and no mass. There is no tenderness.  Musculoskeletal: She exhibits no edema.  Lymphadenopathy:    She has no cervical adenopathy.  Neurological: She is alert.  Skin: Skin is warm and dry.     Assessment/Plan History of colonic adenoma. Family history of CRC in first-degree relative. Surveillance colonoscopy.  Hildred Laser, MD 10/16/2017, 8:12 AM

## 2017-10-16 NOTE — Op Note (Signed)
[facility] Patient Name: April Cobb Procedure Date: 10/16/2017 8:03 AM MRN: [id number] Date of Birth: [birth year] Attending MD: Hildred Laser , MD CSN: [id number] Age: 67 Admit Type: Outpatient Procedure:                Colonoscopy Indications:              High risk colon cancer surveillance: Personal                            history of colonic polyps, Family history of colon                            cancer in a first-degree relative Providers:                Hildred Laser, MD, Charlsie Quest. Theda Sers RN, RN, Nelma Rothman, Technician Referring MD:             Halford Chessman, MD Medicines:                Meperidine 50 mg IV, Midazolam 5 mg IV Complications:            No immediate complications. Estimated Blood Loss:     Estimated blood loss: none. Procedure:                Pre-Anesthesia Assessment:                           - Prior to the procedure, a History and Physical                            was performed, and patient medications and                            allergies were reviewed. The patient's tolerance of                            previous anesthesia was also reviewed. The risks                            and benefits of the procedure and the sedation                            options and risks were discussed with the patient.                            All questions were answered, and informed consent                            was obtained. Prior Anticoagulants: The patient                            last took ibuprofen 1 day prior to the procedure.  ASA Grade Assessment: I - A normal, healthy                            patient. After reviewing the risks and benefits,                            the patient was deemed in satisfactory condition to                            undergo the procedure.                           After obtaining informed consent, the colonoscope                            was passed under  direct vision. Throughout the                            procedure, the patient's blood pressure, pulse, and                            oxygen saturations were monitored continuously. The                            [id number] was introduced through the anus and                            advanced to the the cecum, identified by                            appendiceal orifice and ileocecal valve. The                            colonoscopy was somewhat difficult due to                            restricted mobility of the colon. Successful                            completion of the procedure was aided by changing                            the patient to a supine position and using manual                            pressure. The patient tolerated the procedure well.                            The quality of the bowel preparation was excellent.                            The ileocecal valve, appendiceal orifice, and  rectum were photographed. Scope In: 8:22:18 AM Scope Out: 8:42:38 AM Scope Withdrawal Time: 0 hours 10 minutes 5 seconds  Total Procedure Duration: 0 hours 20 minutes 20 seconds  Findings:      The perianal and digital rectal examinations were normal.      The colon (entire examined portion) appeared normal.      External hemorrhoids were found during retroflexion. The hemorrhoids       were small. Impression:               - The entire examined colon is normal.                           - External hemorrhoids.                           - No specimens collected. Moderate Sedation:      Moderate (conscious) sedation was administered by the endoscopy nurse       and supervised by the endoscopist. The following parameters were       monitored: oxygen saturation, heart rate, blood pressure, CO2       capnography and response to care. Total physician intraservice time was       27 minutes. Recommendation:           - Patient has a contact number  available for                            emergencies. The signs and symptoms of potential                            delayed complications were discussed with the                            patient. Return to normal activities tomorrow.                            Written discharge instructions were provided to the                            patient.                           - Resume previous diet today.                           - Continue present medications.                           - Repeat colonoscopy in 5 years for surveillance. Procedure Code(s):        --- Professional ---                           [id number], Colonoscopy, flexible; diagnostic, including                            collection of specimen(s) by brushing or washing,  when performed (separate procedure)                           99152, Moderate sedation services provided by the                            same physician or other qualified health care                            professional performing the diagnostic or                            therapeutic service that the sedation supports,                            requiring the presence of an independent trained                            observer to assist in the monitoring of the                            patient's level of consciousness and physiological                            status; initial 15 minutes of intraservice time,                            patient age 49 years or older                           [phone], Moderate sedation services; each additional                            15 minutes intraservice time Diagnosis Code(s):        --- Professional ---                           Z86.010, Personal history of colonic polyps                           K64.4, Residual hemorrhoidal skin tags                           Z80.0, Family history of malignant neoplasm of                            digestive organs CPT copyright 2016 American  Medical Association. All rights reserved. The codes documented in this report are preliminary and upon coder review may  be revised to meet current compliance requirements. Hildred Laser, MD Hildred Laser, MD 10/16/2017 8:50:37 AM This report has been signed electronically. Number of Addenda: 0

## 2017-10-16 NOTE — Discharge Instructions (Signed)
Resume usual medications as before. °Resume usual diet. °No driving for 24 hours. °Next colonoscopy in 5 years. ° ° ° ° ° ° °Colonoscopy, Adult, Care After °This sheet gives you information about how to care for yourself after your procedure. Your doctor may also give you more specific instructions. If you have problems or questions, call your doctor. °Follow these instructions at home: °General instructions ° °· For the first 24 hours after the procedure: °? Do not drive or use machinery. °? Do not sign important documents. °? Do not drink alcohol. °? Do your daily activities more slowly than normal. °? Eat foods that are soft and easy to digest. °? Rest often. °· Take over-the-counter or prescription medicines only as told by your doctor. °· It is up to you to get the results of your procedure. Ask your doctor, or the department performing the procedure, when your results will be ready. °To help cramping and bloating: °· Try walking around. °· Put heat on your belly (abdomen) as told by your doctor. Use a heat source that your doctor recommends, such as a moist heat pack or a heating pad. °? Put a towel between your skin and the heat source. °? Leave the heat on for 20-30 minutes. °? Remove the heat if your skin turns bright red. This is especially important if you cannot feel pain, heat, or cold. You can get burned. °Eating and drinking °· Drink enough fluid to keep your pee (urine) clear or pale yellow. °· Return to your normal diet as told by your doctor. Avoid heavy or fried foods that are hard to digest. °· Avoid drinking alcohol for as long as told by your doctor. °Contact a doctor if: °· You have blood in your poop (stool) 2-3 days after the procedure. °Get help right away if: °· You have more than a small amount of blood in your poop. °· You see large clumps of tissue (blood clots) in your poop. °· Your belly is swollen. °· You feel sick to your stomach (nauseous). °· You throw up (vomit). °· You have a  fever. °· You have belly pain that gets worse, and medicine does not help your pain. °This information is not intended to replace advice given to you by your health care provider. Make sure you discuss any questions you have with your health care provider. °Document Released: 08/24/2010 Document Revised: 04/15/2016 Document Reviewed: 04/15/2016 °Elsevier Interactive Patient Education © 2017 Elsevier Inc. ° ° ° ° °Hemorrhoids °Hemorrhoids are swollen veins in and around the rectum or anus. There are two types of hemorrhoids: °· Internal hemorrhoids. These occur in the veins that are just inside the rectum. They may poke through to the outside and become irritated and painful. °· External hemorrhoids. These occur in the veins that are outside of the anus and can be felt as a painful swelling or hard lump near the anus. ° °Most hemorrhoids do not cause serious problems, and they can be managed with home treatments such as diet and lifestyle changes. If home treatments do not help your symptoms, procedures can be done to shrink or remove the hemorrhoids. °What are the causes? °This condition is caused by increased pressure in the anal area. This pressure may result from various things, including: °· Constipation. °· Straining to have a bowel movement. °· Diarrhea. °· Pregnancy. °· Obesity. °· Sitting for long periods of time. °· Heavy lifting or other activity that causes you to strain. °· Anal sex. ° °What are   the signs or symptoms? °Symptoms of this condition include: °· Pain. °· Anal itching or irritation. °· Rectal bleeding. °· Leakage of stool (feces). °· Anal swelling. °· One or more lumps around the anus. ° °How is this diagnosed? °This condition can often be diagnosed through a visual exam. Other exams or tests may also be done, such as: °· Examination of the rectal area with a gloved hand (digital rectal exam). °· Examination of the anal canal using a small tube (anoscope). °· A blood test, if you have lost a  significant amount of blood. °· A test to look inside the colon (sigmoidoscopy or colonoscopy). ° °How is this treated? °This condition can usually be treated at home. However, various procedures may be done if dietary changes, lifestyle changes, and other home treatments do not help your symptoms. These procedures can help make the hemorrhoids smaller or remove them completely. Some of these procedures involve surgery, and others do not. Common procedures include: °· Rubber band ligation. Rubber bands are placed at the base of the hemorrhoids to cut off the blood supply to them. °· Sclerotherapy. Medicine is injected into the hemorrhoids to shrink them. °· Infrared coagulation. A type of light energy is used to get rid of the hemorrhoids. °· Hemorrhoidectomy surgery. The hemorrhoids are surgically removed, and the veins that supply them are tied off. °· Stapled hemorrhoidopexy surgery. A circular stapling device is used to remove the hemorrhoids and use staples to cut off the blood supply to them. ° °Follow these instructions at home: °Eating and drinking °· Eat foods that have a lot of fiber in them, such as whole grains, beans, nuts, fruits, and vegetables. Ask your health care provider about taking products that have added fiber (fiber supplements). °· Drink enough fluid to keep your urine clear or pale yellow. °Managing pain and swelling °· Take warm sitz baths for 20 minutes, 3-4 times a day to ease pain and discomfort. °· If directed, apply ice to the affected area. Using ice packs between sitz baths may be helpful. °? Put ice in a plastic bag. °? Place a towel between your skin and the bag. °? Leave the ice on for 20 minutes, 2-3 times a day. °General instructions °· Take over-the-counter and prescription medicines only as told by your health care provider. °· Use medicated creams or suppositories as told. °· Exercise regularly. °· Go to the bathroom when you have the urge to have a bowel movement. Do not  wait. °· Avoid straining to have bowel movements. °· Keep the anal area dry and clean. Use wet toilet paper or moist towelettes after a bowel movement. °· Do not sit on the toilet for long periods of time. This increases blood pooling and pain. °Contact a health care provider if: °· You have increasing pain and swelling that are not controlled by treatment or medicine. °· You have uncontrolled bleeding. °· You have difficulty having a bowel movement, or you are unable to have a bowel movement. °· You have pain or inflammation outside the area of the hemorrhoids. °This information is not intended to replace advice given to you by your health care provider. Make sure you discuss any questions you have with your health care provider. °Document Released: 07/19/2000 Document Revised: 12/20/2015 Document Reviewed: 04/05/2015 °Elsevier Interactive Patient Education © 2018 Elsevier Inc. ° °

## 2017-10-22 ENCOUNTER — Encounter (HOSPITAL_COMMUNITY): Payer: Self-pay | Admitting: Internal Medicine

## 2017-12-31 DIAGNOSIS — R69 Illness, unspecified: Secondary | ICD-10-CM | POA: Diagnosis not present

## 2018-01-30 DIAGNOSIS — Z1389 Encounter for screening for other disorder: Secondary | ICD-10-CM | POA: Diagnosis not present

## 2018-01-30 DIAGNOSIS — Z6823 Body mass index (BMI) 23.0-23.9, adult: Secondary | ICD-10-CM | POA: Diagnosis not present

## 2018-01-30 DIAGNOSIS — R69 Illness, unspecified: Secondary | ICD-10-CM | POA: Diagnosis not present

## 2018-03-02 ENCOUNTER — Other Ambulatory Visit (HOSPITAL_COMMUNITY): Payer: Self-pay | Admitting: Family Medicine

## 2018-03-02 DIAGNOSIS — Z1231 Encounter for screening mammogram for malignant neoplasm of breast: Secondary | ICD-10-CM

## 2018-03-05 ENCOUNTER — Ambulatory Visit (HOSPITAL_COMMUNITY)
Admission: RE | Admit: 2018-03-05 | Discharge: 2018-03-05 | Disposition: A | Discharge status: Home / Self Care | Payer: Medicare HMO | Source: Ambulatory Visit | Attending: Family Medicine | Admitting: Family Medicine

## 2018-03-05 ENCOUNTER — Encounter (HOSPITAL_COMMUNITY): Payer: Self-pay

## 2018-03-05 DIAGNOSIS — Z1231 Encounter for screening mammogram for malignant neoplasm of breast: Secondary | ICD-10-CM | POA: Insufficient documentation

## 2018-03-19 DIAGNOSIS — Z0000 Encounter for general adult medical examination without abnormal findings: Secondary | ICD-10-CM | POA: Diagnosis not present

## 2018-03-19 DIAGNOSIS — Z1389 Encounter for screening for other disorder: Secondary | ICD-10-CM | POA: Diagnosis not present

## 2018-03-19 DIAGNOSIS — J302 Other seasonal allergic rhinitis: Secondary | ICD-10-CM | POA: Diagnosis not present

## 2018-03-19 DIAGNOSIS — Z6822 Body mass index (BMI) 22.0-22.9, adult: Secondary | ICD-10-CM | POA: Diagnosis not present

## 2018-03-19 DIAGNOSIS — M810 Age-related osteoporosis without current pathological fracture: Secondary | ICD-10-CM | POA: Diagnosis not present

## 2018-03-19 DIAGNOSIS — R69 Illness, unspecified: Secondary | ICD-10-CM | POA: Diagnosis not present

## 2018-03-19 DIAGNOSIS — E782 Mixed hyperlipidemia: Secondary | ICD-10-CM | POA: Diagnosis not present

## 2018-03-19 DIAGNOSIS — K219 Gastro-esophageal reflux disease without esophagitis: Secondary | ICD-10-CM | POA: Diagnosis not present

## 2018-04-14 DIAGNOSIS — R69 Illness, unspecified: Secondary | ICD-10-CM | POA: Diagnosis not present

## 2018-04-28 DIAGNOSIS — Z0100 Encounter for examination of eyes and vision without abnormal findings: Secondary | ICD-10-CM | POA: Diagnosis not present

## 2018-04-28 DIAGNOSIS — H5200 Hypermetropia, unspecified eye: Secondary | ICD-10-CM | POA: Diagnosis not present

## 2018-06-29 DIAGNOSIS — E039 Hypothyroidism, unspecified: Secondary | ICD-10-CM | POA: Diagnosis not present

## 2018-06-29 DIAGNOSIS — Z1389 Encounter for screening for other disorder: Secondary | ICD-10-CM | POA: Diagnosis not present

## 2018-06-29 DIAGNOSIS — Z6823 Body mass index (BMI) 23.0-23.9, adult: Secondary | ICD-10-CM | POA: Diagnosis not present

## 2018-07-14 DIAGNOSIS — R69 Illness, unspecified: Secondary | ICD-10-CM | POA: Diagnosis not present

## 2018-10-29 DIAGNOSIS — Z6823 Body mass index (BMI) 23.0-23.9, adult: Secondary | ICD-10-CM | POA: Diagnosis not present

## 2018-10-29 DIAGNOSIS — G4700 Insomnia, unspecified: Secondary | ICD-10-CM | POA: Diagnosis not present

## 2018-10-29 DIAGNOSIS — Z1389 Encounter for screening for other disorder: Secondary | ICD-10-CM | POA: Diagnosis not present

## 2018-10-29 DIAGNOSIS — E039 Hypothyroidism, unspecified: Secondary | ICD-10-CM | POA: Diagnosis not present

## 2019-01-19 DIAGNOSIS — R69 Illness, unspecified: Secondary | ICD-10-CM | POA: Diagnosis not present

## 2019-03-22 DIAGNOSIS — G4700 Insomnia, unspecified: Secondary | ICD-10-CM | POA: Diagnosis not present

## 2019-03-22 DIAGNOSIS — E039 Hypothyroidism, unspecified: Secondary | ICD-10-CM | POA: Diagnosis not present

## 2019-03-22 DIAGNOSIS — Z6822 Body mass index (BMI) 22.0-22.9, adult: Secondary | ICD-10-CM | POA: Diagnosis not present

## 2019-03-22 DIAGNOSIS — E7849 Other hyperlipidemia: Secondary | ICD-10-CM | POA: Diagnosis not present

## 2019-03-22 DIAGNOSIS — Z1389 Encounter for screening for other disorder: Secondary | ICD-10-CM | POA: Diagnosis not present

## 2019-03-22 DIAGNOSIS — Z0001 Encounter for general adult medical examination with abnormal findings: Secondary | ICD-10-CM | POA: Diagnosis not present

## 2019-04-05 DIAGNOSIS — L570 Actinic keratosis: Secondary | ICD-10-CM | POA: Diagnosis not present

## 2019-04-05 DIAGNOSIS — E782 Mixed hyperlipidemia: Secondary | ICD-10-CM | POA: Diagnosis not present

## 2019-04-05 DIAGNOSIS — M810 Age-related osteoporosis without current pathological fracture: Secondary | ICD-10-CM | POA: Diagnosis not present

## 2019-04-05 DIAGNOSIS — E039 Hypothyroidism, unspecified: Secondary | ICD-10-CM | POA: Diagnosis not present

## 2019-04-27 DIAGNOSIS — R69 Illness, unspecified: Secondary | ICD-10-CM | POA: Diagnosis not present

## 2019-08-16 DIAGNOSIS — R69 Illness, unspecified: Secondary | ICD-10-CM | POA: Diagnosis not present

## 2019-09-17 DIAGNOSIS — Z1389 Encounter for screening for other disorder: Secondary | ICD-10-CM | POA: Diagnosis not present

## 2019-09-17 DIAGNOSIS — Z6823 Body mass index (BMI) 23.0-23.9, adult: Secondary | ICD-10-CM | POA: Diagnosis not present

## 2019-09-17 DIAGNOSIS — E039 Hypothyroidism, unspecified: Secondary | ICD-10-CM | POA: Diagnosis not present

## 2019-09-17 DIAGNOSIS — M810 Age-related osteoporosis without current pathological fracture: Secondary | ICD-10-CM | POA: Diagnosis not present

## 2019-09-17 DIAGNOSIS — G4700 Insomnia, unspecified: Secondary | ICD-10-CM | POA: Diagnosis not present

## 2019-09-17 DIAGNOSIS — E7849 Other hyperlipidemia: Secondary | ICD-10-CM | POA: Diagnosis not present

## 2019-10-06 DIAGNOSIS — Z6823 Body mass index (BMI) 23.0-23.9, adult: Secondary | ICD-10-CM | POA: Diagnosis not present

## 2019-10-06 DIAGNOSIS — M25519 Pain in unspecified shoulder: Secondary | ICD-10-CM | POA: Diagnosis not present

## 2019-10-06 DIAGNOSIS — M719 Bursopathy, unspecified: Secondary | ICD-10-CM | POA: Diagnosis not present

## 2019-10-11 ENCOUNTER — Ambulatory Visit: Payer: Medicare HMO | Attending: Internal Medicine

## 2019-10-11 ENCOUNTER — Other Ambulatory Visit: Payer: Self-pay

## 2019-10-11 DIAGNOSIS — Z20822 Contact with and (suspected) exposure to covid-19: Secondary | ICD-10-CM

## 2019-10-12 LAB — NOVEL CORONAVIRUS, NAA: SARS-CoV-2, NAA: DETECTED — AB

## 2019-10-13 ENCOUNTER — Telehealth: Payer: Self-pay | Admitting: Nurse Practitioner

## 2019-10-13 NOTE — Telephone Encounter (Signed)
Called to Discuss with patient about Covid symptoms and the use of bamlanivimab, a monoclonal antibody infusion for those with mild to moderate Covid symptoms and at a high risk of hospitalization.     Pt states that symptoms started over 10 days ago. She states that she is feeling much better now.

## 2019-11-10 ENCOUNTER — Other Ambulatory Visit (HOSPITAL_COMMUNITY): Payer: Self-pay | Admitting: Family Medicine

## 2019-11-10 DIAGNOSIS — Z1231 Encounter for screening mammogram for malignant neoplasm of breast: Secondary | ICD-10-CM

## 2019-11-15 ENCOUNTER — Ambulatory Visit (HOSPITAL_COMMUNITY)
Admission: RE | Admit: 2019-11-15 | Discharge: 2019-11-15 | Disposition: A | Discharge status: Home / Self Care | Payer: Medicare HMO | Source: Ambulatory Visit | Attending: Family Medicine | Admitting: Family Medicine

## 2019-11-15 ENCOUNTER — Other Ambulatory Visit: Payer: Self-pay

## 2019-11-15 DIAGNOSIS — Z1231 Encounter for screening mammogram for malignant neoplasm of breast: Secondary | ICD-10-CM | POA: Insufficient documentation

## 2019-12-30 ENCOUNTER — Ambulatory Visit: Payer: Medicare HMO | Attending: Internal Medicine

## 2019-12-30 DIAGNOSIS — Z23 Encounter for immunization: Secondary | ICD-10-CM

## 2019-12-30 NOTE — Progress Notes (Signed)
   Covid-19 Vaccination Clinic  Name:  April Cobb    MRN: [id number] DOB: 1951/04/05  12/30/2019  [name] was observed post Covid-19 immunization for 15 minutes without incident. She was provided with Vaccine Information Sheet and instruction to access the V-Safe system.   [name] was instructed to call 911 with any severe reactions post vaccine: Marland Kitchen Difficulty breathing  . Swelling of face and throat  . A fast heartbeat  . A bad rash all over body  . Dizziness and weakness   Immunizations Administered    Name Date Dose VIS Date Route   Moderna COVID-19 Vaccine 12/30/2019  1:16 PM 0.5 mL 07/2019 Intramuscular   Manufacturer: Moderna   Lot: [id number]   [id number]

## 2020-03-01 DIAGNOSIS — R69 Illness, unspecified: Secondary | ICD-10-CM | POA: Diagnosis not present

## 2020-03-23 DIAGNOSIS — E039 Hypothyroidism, unspecified: Secondary | ICD-10-CM | POA: Diagnosis not present

## 2020-03-23 DIAGNOSIS — E7849 Other hyperlipidemia: Secondary | ICD-10-CM | POA: Diagnosis not present

## 2020-03-23 DIAGNOSIS — R69 Illness, unspecified: Secondary | ICD-10-CM | POA: Diagnosis not present

## 2020-03-23 DIAGNOSIS — Z0000 Encounter for general adult medical examination without abnormal findings: Secondary | ICD-10-CM | POA: Diagnosis not present

## 2020-03-23 DIAGNOSIS — Z1389 Encounter for screening for other disorder: Secondary | ICD-10-CM | POA: Diagnosis not present

## 2020-03-23 DIAGNOSIS — Z6822 Body mass index (BMI) 22.0-22.9, adult: Secondary | ICD-10-CM | POA: Diagnosis not present

## 2020-03-24 DIAGNOSIS — E559 Vitamin D deficiency, unspecified: Secondary | ICD-10-CM | POA: Diagnosis not present

## 2020-03-24 DIAGNOSIS — R69 Illness, unspecified: Secondary | ICD-10-CM | POA: Diagnosis not present

## 2020-03-24 DIAGNOSIS — Z0001 Encounter for general adult medical examination with abnormal findings: Secondary | ICD-10-CM | POA: Diagnosis not present

## 2020-03-24 DIAGNOSIS — Z1389 Encounter for screening for other disorder: Secondary | ICD-10-CM | POA: Diagnosis not present

## 2020-03-24 DIAGNOSIS — E039 Hypothyroidism, unspecified: Secondary | ICD-10-CM | POA: Diagnosis not present

## 2020-05-09 DIAGNOSIS — R69 Illness, unspecified: Secondary | ICD-10-CM | POA: Diagnosis not present

## 2020-05-11 ENCOUNTER — Other Ambulatory Visit (HOSPITAL_COMMUNITY): Payer: Self-pay | Admitting: Physician Assistant

## 2020-05-11 ENCOUNTER — Other Ambulatory Visit: Payer: Self-pay

## 2020-05-11 ENCOUNTER — Ambulatory Visit (HOSPITAL_COMMUNITY)
Admission: RE | Admit: 2020-05-11 | Discharge: 2020-05-11 | Disposition: A | Discharge status: Home / Self Care | Payer: Medicare HMO | Source: Ambulatory Visit | Attending: Physician Assistant | Admitting: Physician Assistant

## 2020-05-11 DIAGNOSIS — M25511 Pain in right shoulder: Secondary | ICD-10-CM

## 2020-05-11 DIAGNOSIS — G8929 Other chronic pain: Secondary | ICD-10-CM | POA: Diagnosis not present

## 2020-05-11 DIAGNOSIS — Z6823 Body mass index (BMI) 23.0-23.9, adult: Secondary | ICD-10-CM | POA: Diagnosis not present

## 2020-05-11 DIAGNOSIS — R39198 Other difficulties with micturition: Secondary | ICD-10-CM | POA: Diagnosis not present

## 2020-05-23 DIAGNOSIS — M7551 Bursitis of right shoulder: Secondary | ICD-10-CM | POA: Diagnosis not present

## 2020-05-23 DIAGNOSIS — Z6823 Body mass index (BMI) 23.0-23.9, adult: Secondary | ICD-10-CM | POA: Diagnosis not present

## 2020-05-23 DIAGNOSIS — M7541 Impingement syndrome of right shoulder: Secondary | ICD-10-CM | POA: Diagnosis not present

## 2020-07-06 ENCOUNTER — Ambulatory Visit: Payer: Medicare HMO | Attending: Internal Medicine

## 2020-07-06 DIAGNOSIS — Z23 Encounter for immunization: Secondary | ICD-10-CM

## 2020-07-06 NOTE — Progress Notes (Signed)
   Covid-19 Vaccination Clinic  Name:  KYLIYAH STIRN    MRN: [id number] DOB: [birth year]  07/06/2020  Ms. Kinne was observed post Covid-19 immunization for 15 minutes without incident. She was provided with Vaccine Information Sheet and instruction to access the V-Safe system.   Ms. Hubers was instructed to call 911 with any severe reactions post vaccine: Marland Kitchen Difficulty breathing  . Swelling of face and throat  . A fast heartbeat  . A bad rash all over body  . Dizziness and weakness   Immunizations Administered    No immunizations on file.

## 2020-09-01 DIAGNOSIS — J069 Acute upper respiratory infection, unspecified: Secondary | ICD-10-CM | POA: Diagnosis not present

## 2020-09-01 DIAGNOSIS — Z681 Body mass index (BMI) 19 or less, adult: Secondary | ICD-10-CM | POA: Diagnosis not present

## 2020-09-01 DIAGNOSIS — J019 Acute sinusitis, unspecified: Secondary | ICD-10-CM | POA: Diagnosis not present

## 2020-12-26 DIAGNOSIS — K529 Noninfective gastroenteritis and colitis, unspecified: Secondary | ICD-10-CM | POA: Diagnosis not present

## 2020-12-26 DIAGNOSIS — R197 Diarrhea, unspecified: Secondary | ICD-10-CM | POA: Diagnosis not present

## 2020-12-26 DIAGNOSIS — E063 Autoimmune thyroiditis: Secondary | ICD-10-CM | POA: Diagnosis not present

## 2021-01-11 DIAGNOSIS — F321 Major depressive disorder, single episode, moderate: Secondary | ICD-10-CM | POA: Diagnosis not present

## 2021-01-11 DIAGNOSIS — R69 Illness, unspecified: Secondary | ICD-10-CM | POA: Diagnosis not present

## 2021-01-11 DIAGNOSIS — E039 Hypothyroidism, unspecified: Secondary | ICD-10-CM | POA: Diagnosis not present

## 2021-01-11 DIAGNOSIS — Z1389 Encounter for screening for other disorder: Secondary | ICD-10-CM | POA: Diagnosis not present

## 2021-01-11 DIAGNOSIS — E559 Vitamin D deficiency, unspecified: Secondary | ICD-10-CM | POA: Diagnosis not present

## 2021-01-11 DIAGNOSIS — Z6822 Body mass index (BMI) 22.0-22.9, adult: Secondary | ICD-10-CM | POA: Diagnosis not present

## 2021-04-03 DIAGNOSIS — R69 Illness, unspecified: Secondary | ICD-10-CM | POA: Diagnosis not present

## 2021-04-03 DIAGNOSIS — E559 Vitamin D deficiency, unspecified: Secondary | ICD-10-CM | POA: Diagnosis not present

## 2021-04-03 DIAGNOSIS — Z6822 Body mass index (BMI) 22.0-22.9, adult: Secondary | ICD-10-CM | POA: Diagnosis not present

## 2021-04-03 DIAGNOSIS — E782 Mixed hyperlipidemia: Secondary | ICD-10-CM | POA: Diagnosis not present

## 2021-04-03 DIAGNOSIS — E039 Hypothyroidism, unspecified: Secondary | ICD-10-CM | POA: Diagnosis not present

## 2021-04-03 DIAGNOSIS — Z1331 Encounter for screening for depression: Secondary | ICD-10-CM | POA: Diagnosis not present

## 2021-04-03 DIAGNOSIS — Z1389 Encounter for screening for other disorder: Secondary | ICD-10-CM | POA: Diagnosis not present

## 2021-04-03 DIAGNOSIS — G4700 Insomnia, unspecified: Secondary | ICD-10-CM | POA: Diagnosis not present

## 2021-04-03 DIAGNOSIS — Z0000 Encounter for general adult medical examination without abnormal findings: Secondary | ICD-10-CM | POA: Diagnosis not present

## 2021-07-05 DIAGNOSIS — Z6821 Body mass index (BMI) 21.0-21.9, adult: Secondary | ICD-10-CM | POA: Diagnosis not present

## 2021-07-05 DIAGNOSIS — M62830 Muscle spasm of back: Secondary | ICD-10-CM | POA: Diagnosis not present

## 2021-09-17 DIAGNOSIS — L821 Other seborrheic keratosis: Secondary | ICD-10-CM | POA: Diagnosis not present

## 2021-09-17 DIAGNOSIS — E663 Overweight: Secondary | ICD-10-CM | POA: Diagnosis not present

## 2021-09-17 DIAGNOSIS — Z6822 Body mass index (BMI) 22.0-22.9, adult: Secondary | ICD-10-CM | POA: Diagnosis not present

## 2021-09-26 DIAGNOSIS — H5213 Myopia, bilateral: Secondary | ICD-10-CM | POA: Diagnosis not present

## 2021-09-26 DIAGNOSIS — Z0100 Encounter for examination of eyes and vision without abnormal findings: Secondary | ICD-10-CM | POA: Diagnosis not present

## 2021-10-05 IMAGING — MG DIGITAL SCREENING BILAT W/ TOMO W/ CAD
6 of 10 series · 6 of 30 positions shown · non-contrast
Comparison: Previous exam(s).

CLINICAL DATA: Screening.

EXAM:
DIGITAL SCREENING BILATERAL MAMMOGRAM WITH TOMO AND CAD

[L MLO synth-2D]
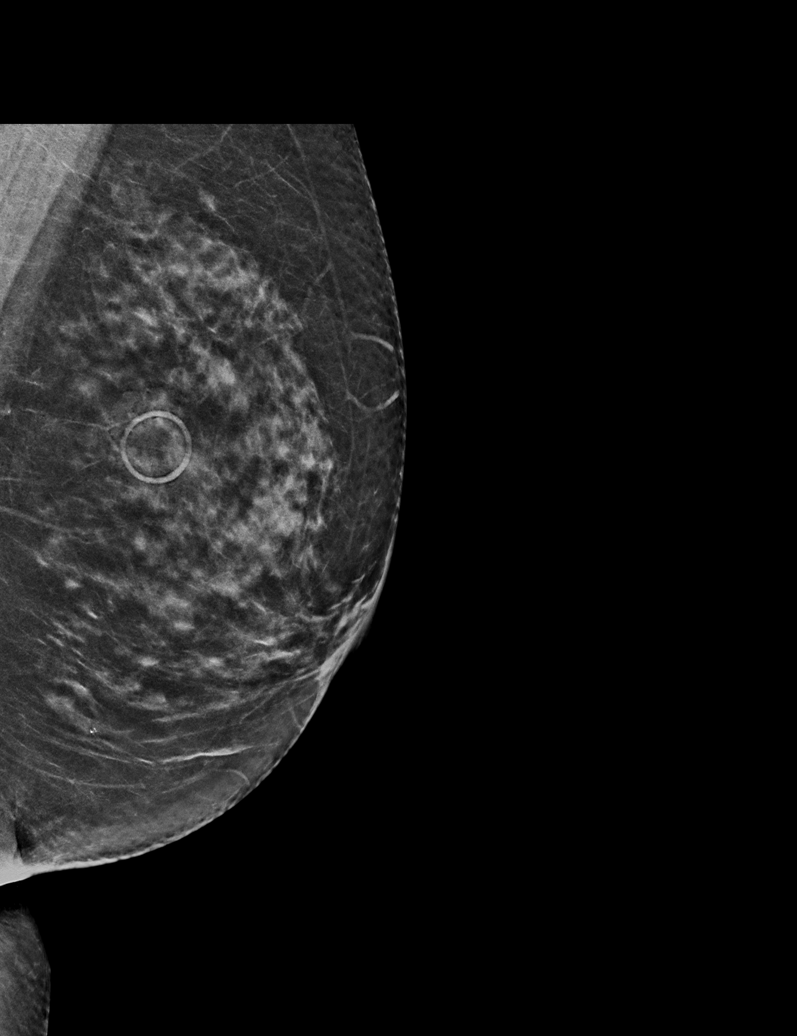

[L CC synth-2D]
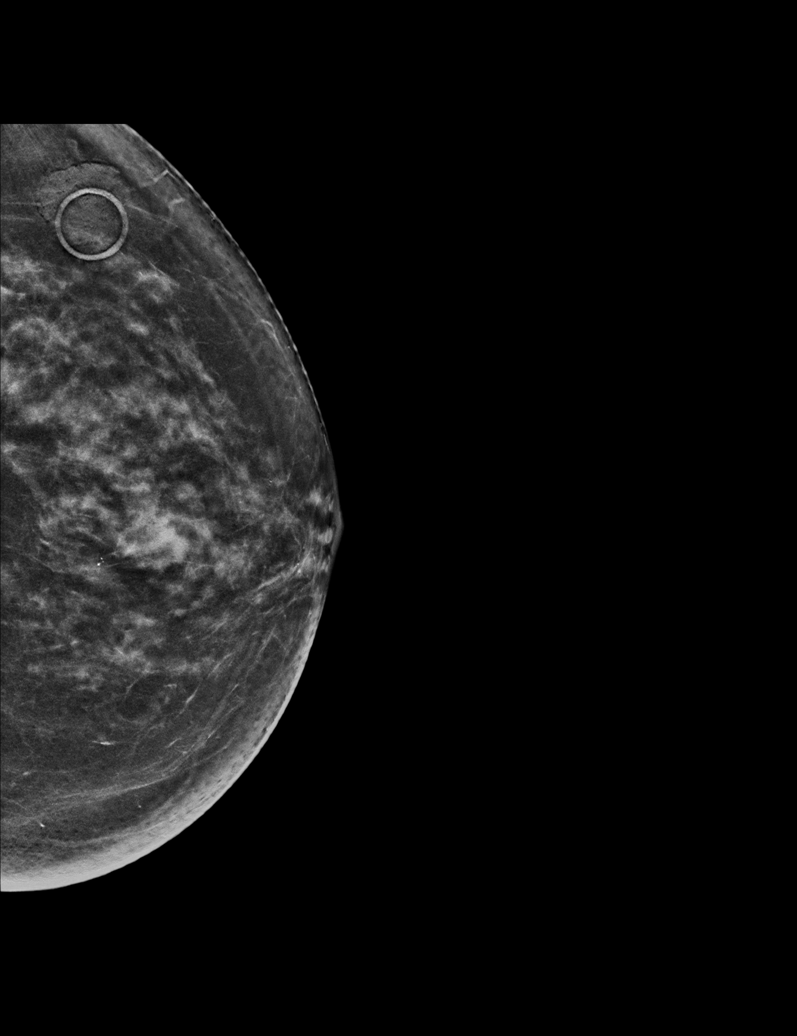

[R CC synth-2D (1 of 2)]
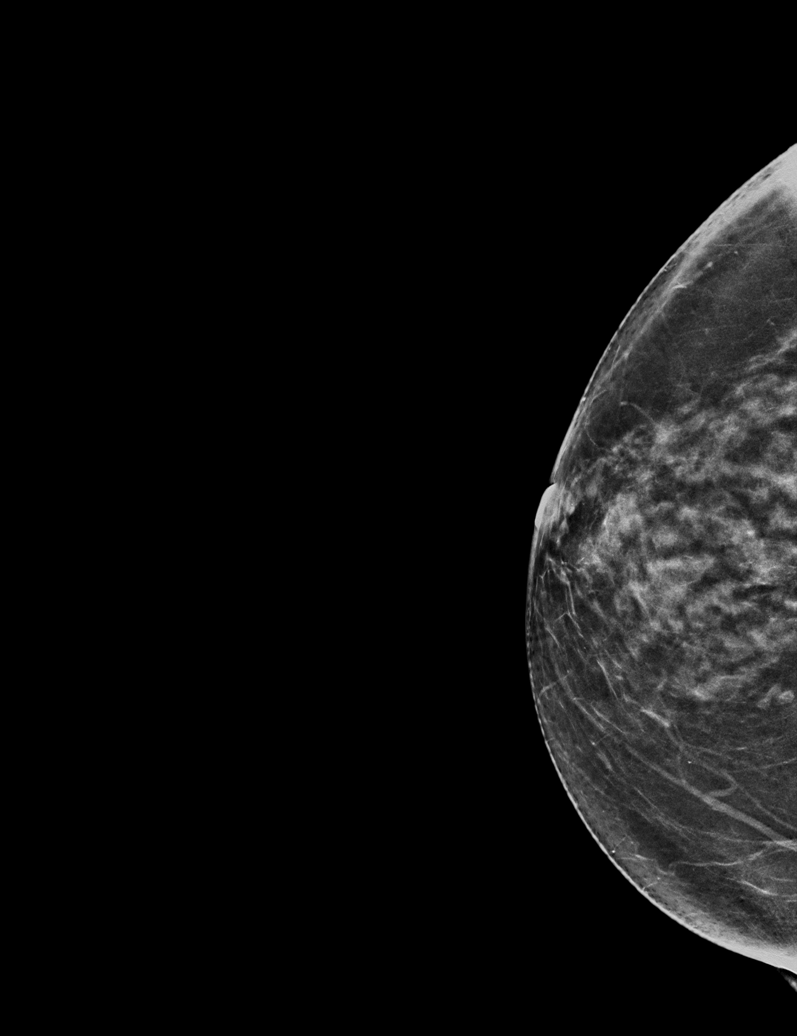

[R CC synth-2D (2 of 2)]
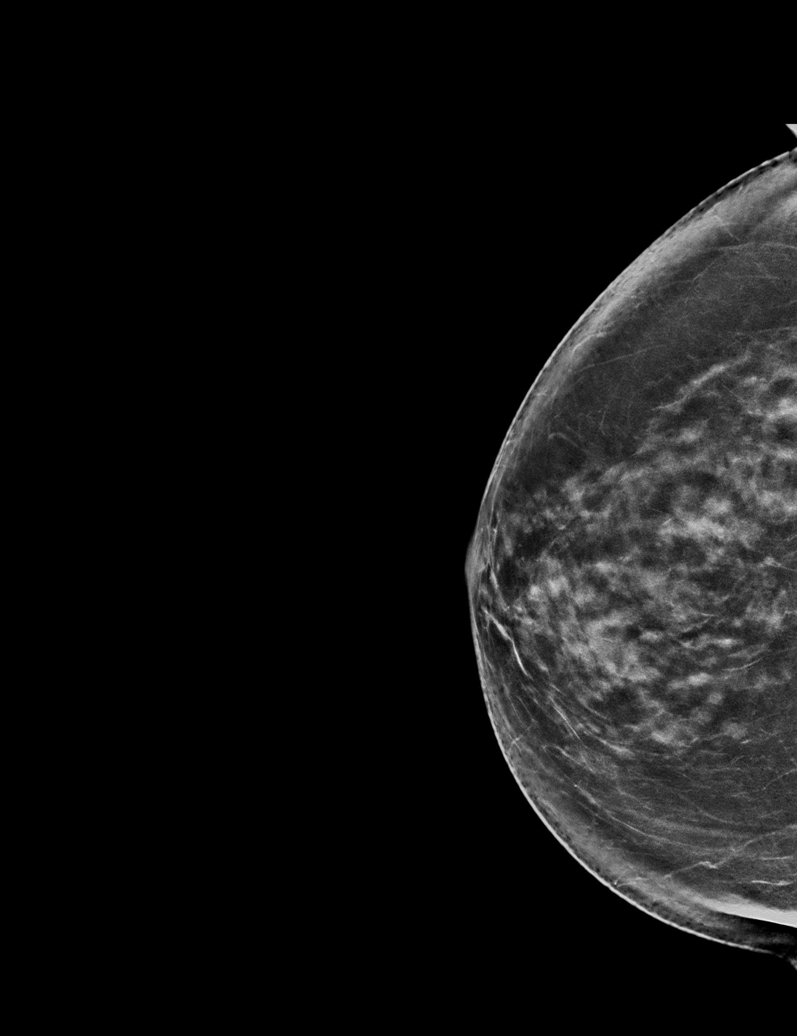

[R MLO synth-2D]
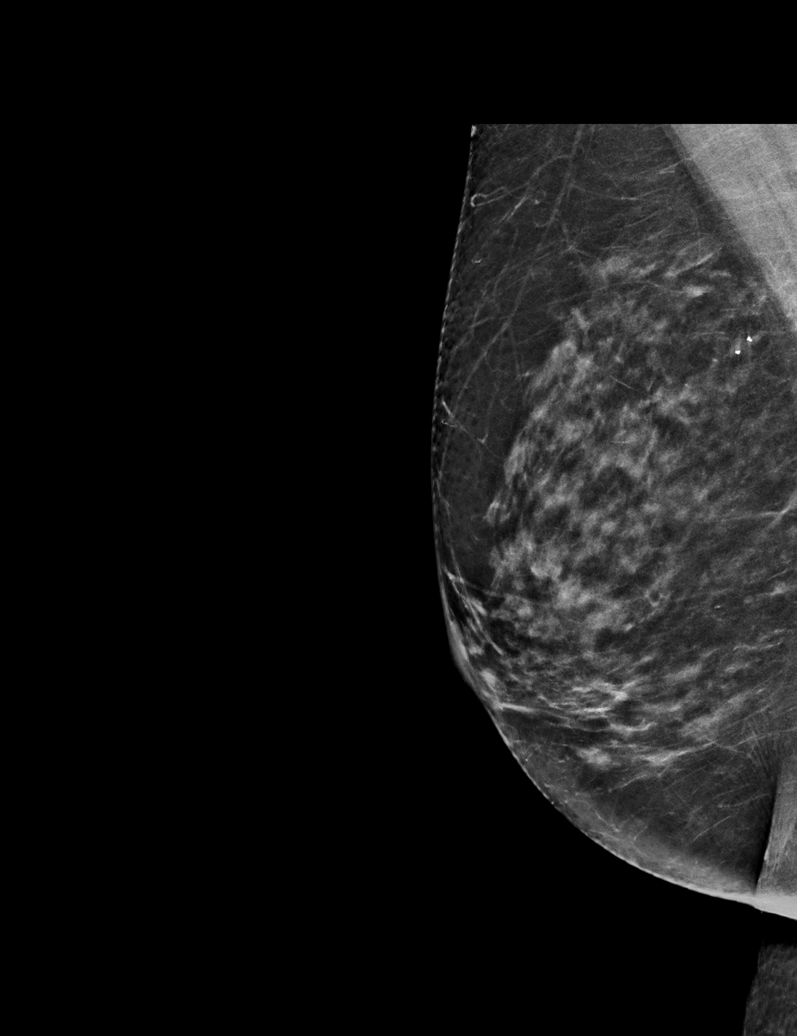

[R MLO tomo · tomo slice 33/64.0]
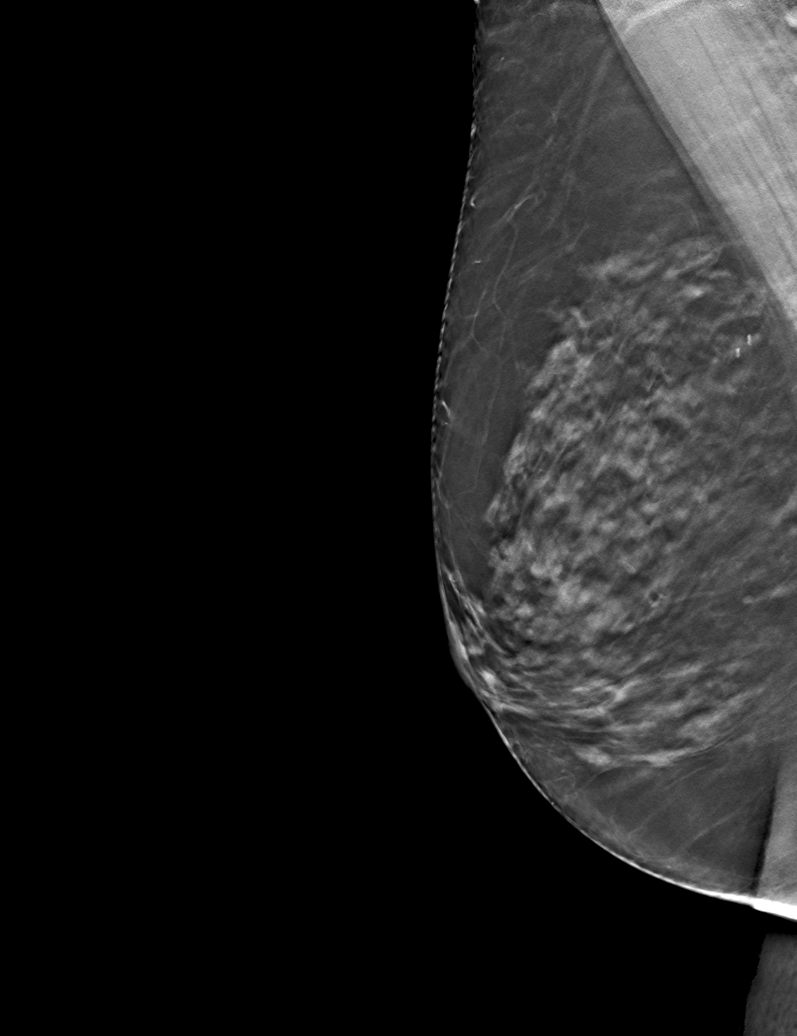

[6 of 30 positions shown; findings below may reference images not displayed]

ACR Breast Density Category c: The breast tissue is heterogeneously
dense, which may obscure small masses.
FINDINGS: There are no findings suspicious for malignancy. Images were
processed with CAD.
IMPRESSION: No mammographic evidence of malignancy. A result letter of this
screening mammogram will be mailed directly to the patient.

RECOMMENDATION:
Screening mammogram in one year. (Code:[id number])

BI-RADS CATEGORY  1: Negative.

## 2022-01-02 DIAGNOSIS — R002 Palpitations: Secondary | ICD-10-CM | POA: Diagnosis not present

## 2022-01-02 DIAGNOSIS — Z6822 Body mass index (BMI) 22.0-22.9, adult: Secondary | ICD-10-CM | POA: Diagnosis not present

## 2022-01-02 DIAGNOSIS — G4700 Insomnia, unspecified: Secondary | ICD-10-CM | POA: Diagnosis not present

## 2022-01-02 DIAGNOSIS — E039 Hypothyroidism, unspecified: Secondary | ICD-10-CM | POA: Diagnosis not present

## 2022-01-17 NOTE — Progress Notes (Unsigned)
Cardiology Office Note:    Date:  01/21/2022   ID:  April Cobb, DOB [birth year], MRN [id number]  PCP:  Sharilyn Sites, MD   [facility] Providers Cardiologist:  None {  Referring MD: Sharilyn Sites, MD    History of Present Illness:    April Cobb is a 71 y.o. female with a hx of HLD who was referred by Dr. Hilma Favors for further evaluation of palpitations.  Today, the patient states that last year she developed intermittent flutters in her chest. She initially ignored it but then she had an episode in 09/2021 where she lost her vision. She did not lose consciousness at that time and her vision returned within a couple of seconds. No associated chest pain or SOB but felt lightheaded at the time. She did not seek medical attention afterwards but was recently seen by Dr. Hilma Favors and discussed her symptoms prompting her to be referred to Cardiology.  Currently, she continues to have intermittent palpitations lasting a few seconds before abating. No known triggers and nothing seems to make it better. No recurrent loss of vision. Just had her synthroid increased, however, her symptoms proceeded the new dose. Otherwise, she is active with no chest pain. She is able to garden and work around the house without symptoms. No LE edema, orthopnea, or PND.   Past Medical History:  Diagnosis Date   Hyperlipidemia    did not tolerate lipitor   Hypothyroidism    UTI (lower urinary tract infection)    frequent, [facility]    Past Surgical History:  Procedure Laterality Date   COLONOSCOPY  06/2002   Dr. Tamera Punt internal hemorrhoids.    COLONOSCOPY  05/03/2011   Procedure: COLONOSCOPY;  Surgeon: Dorothyann Peng, MD;  Location: [facility];  Service: Endoscopy;  Laterality: N/A;  9:30   COLONOSCOPY N/A 10/16/2017   Procedure: COLONOSCOPY;  Surgeon: Rogene Houston, MD;  Location: [facility];  Service: Endoscopy;  Laterality: N/A;  830   HEMORRHOID SURGERY      Current  Medications: Current Meds  Medication Sig   acetaminophen (TYLENOL) 500 MG tablet Take 500 mg by mouth every 6 (six) hours as needed. For pain    buPROPion (WELLBUTRIN XL) 300 MG 24 hr tablet Take 300 mg by mouth daily.   ibuprofen (ADVIL,MOTRIN) 200 MG tablet Take 200 mg by mouth every 6 (six) hours as needed.   levothyroxine (SYNTHROID) 50 MCG tablet Take 50 mcg by mouth daily.   lovastatin (MEVACOR) 10 MG tablet Take 10 mg by mouth at bedtime.   zolpidem (AMBIEN) 10 MG tablet Take 5 mg by mouth at bedtime as needed.     Allergies:   Codeine, Avelox [moxifloxacin hcl in nacl], Darvocet [propoxyphene n-acetaminophen], Lipitor [atorvastatin calcium], Zithromax [azithromycin dihydrate], and Sulfa antibiotics   Social History   Socioeconomic History   Marital status: Married    Spouse name: Not on file   Number of children: 0   Years of education: Not on file   Highest education level: Not on file  Occupational History   Occupation: retired    Fish farm manager: retired  Tobacco Use   Smoking status: Former    Packs/day: 0.30    Types: Cigarettes   Smokeless tobacco: Never   Tobacco comments:    quit 2017  Vaping Use   Vaping Use: Never used  Substance and Sexual Activity   Alcohol use: No   Drug use: No   Sexual activity: Not on file  Other Topics Concern   Not on file  Social History Narrative   Not on file   Social Determinants of Health   Financial Resource Strain: Not on file  Food Insecurity: Not on file  Transportation Needs: Not on file  Physical Activity: Not on file  Stress: Not on file  Social Connections: Not on file     Family History: The patient's family history includes Colon cancer (age of onset: 109) in her sister; Colon polyps in her brother, mother, and sister; Diabetes in her mother; Heart attack in her sister; Heart attack (age of onset: 79) in her father.  ROS:   Please see the history of present illness.     All other systems reviewed and are  negative.  EKGs/Labs/Other Studies Reviewed:    The following studies were reviewed today: No CV studies  EKG:  No ECG performed today (patient left before ECG performed). Cardiac monitor is ordered.   Recent Labs: No results found for requested labs within last 365 days.  Recent Lipid Panel No results found for: "CHOL", "TRIG", "HDL", "CHOLHDL", "VLDL", "LDLCALC", "LDLDIRECT"   Risk Assessment/Calculations:           Physical Exam:    VS:  BP 115/70   Pulse 83   Ht '5\' 2"'$  (1.575 m)   Wt 124 lb 9.6 oz (56.5 kg)   SpO2 97%   BMI 22.79 kg/m     Wt Readings from Last 3 Encounters:  01/21/22 124 lb 9.6 oz (56.5 kg)  10/16/17 124 lb (56.2 kg)  04/18/11 109 lb 12.8 oz (49.8 kg)     GEN:  Well nourished, well developed in no acute distress HEENT: Normal NECK: No JVD; No carotid bruits CARDIAC: RRR, no murmurs, rubs, gallops RESPIRATORY:  Clear to auscultation without rales, wheezing or rhonchi  ABDOMEN: Soft, non-tender, non-distended MUSCULOSKELETAL:  No edema; No deformity  SKIN: Warm and dry NEUROLOGIC:  Alert and oriented x 3 PSYCHIATRIC:  Normal affect   ASSESSMENT:    1. Palpitations   2. Pure hypercholesterolemia    PLAN:    In order of problems listed above:  #Palpitations: Patient reports intermittent episodes of palpitations that have been ongoing since last year. Had one episode in 09/2021 where she lost her vision temporarily but no syncope. Also had associated lightheadedness at that time. She has not had recurrence of the vision loss, but continues to have intermittent palpitations throughout the day. Will check zio monitor for further evaluation. Patient declined TTE unless monitor abnormal. -Check zio monitor -Declined TTE unless zio abnromal -Increase hydration -Symptoms preceded increase in sythroid; will have continued monitoring of TSH with PCP  #HLD: -Continue lovastatin '10mg'$  daily -Can consider Ca score in the future        Medication  Adjustments/Labs and Tests Ordered: Current medicines are reviewed at length with the patient today.  Concerns regarding medicines are outlined above.  Orders Placed This Encounter  Procedures   EKG 12-Lead   No orders of the defined types were placed in this encounter.   Patient Instructions  Medication Instructions:  Continue all current medications.    Labwork: none  Testing/Procedures: Your physician has recommended that you wear a 14 day event monitor. Event monitors are medical devices that record the heart's electrical activity. Doctors most often [place] these monitors to diagnose arrhythmias. Arrhythmias are problems with the speed or rhythm of the heartbeat. The monitor is a small, portable device. You can wear one while you do your normal  daily activities. This is usually used to diagnose what is causing palpitations/syncope (passing out). Office will contact with results via phone or letter.     Follow-Up:  Your physician wants you to follow up in:  1 year.  You should receive a call from the office when due.  If you don't receive this call, please call our office to schedule the follow up appointment    Any Other Special Instructions Will Be Listed Below (If Applicable).   If you need a refill on your cardiac medications before your next appointment, please call your pharmacy.    Signed, Freada Bergeron, MD  01/21/2022 1:49 PM    [facility]

## 2022-01-21 ENCOUNTER — Ambulatory Visit (INDEPENDENT_AMBULATORY_CARE_PROVIDER_SITE_OTHER): Payer: Medicare HMO

## 2022-01-21 ENCOUNTER — Other Ambulatory Visit: Payer: Self-pay | Admitting: Cardiology

## 2022-01-21 ENCOUNTER — Encounter: Payer: Self-pay | Admitting: Cardiology

## 2022-01-21 ENCOUNTER — Ambulatory Visit: Payer: Medicare HMO | Admitting: Cardiology

## 2022-01-21 VITALS — BP 115/70 | HR 83 | Ht 62.0 in | Wt 124.6 lb

## 2022-01-21 DIAGNOSIS — R002 Palpitations: Secondary | ICD-10-CM | POA: Diagnosis not present

## 2022-01-21 DIAGNOSIS — E7800 Pure hypercholesterolemia, unspecified: Secondary | ICD-10-CM | POA: Diagnosis not present

## 2022-01-21 NOTE — Patient Instructions (Signed)
Medication Instructions:  Continue all current medications.    Labwork: none  Testing/Procedures: Your physician has recommended that you wear a 14 day event monitor. Event monitors are medical devices that record the heart's electrical activity. Doctors most often [place] these monitors to diagnose arrhythmias. Arrhythmias are problems with the speed or rhythm of the heartbeat. The monitor is a small, portable device. You can wear one while you do your normal daily activities. This is usually used to diagnose what is causing palpitations/syncope (passing out). Office will contact with results via phone or letter.     Follow-Up:  Your physician wants you to follow up in:  1 year.  You should receive a call from the office when due.  If you don't receive this call, please call our office to schedule the follow up appointment    Any Other Special Instructions Will Be Listed Below (If Applicable).   If you need a refill on your cardiac medications before your next appointment, please call your pharmacy.

## 2022-01-24 DIAGNOSIS — R002 Palpitations: Secondary | ICD-10-CM

## 2022-02-04 DIAGNOSIS — M25511 Pain in right shoulder: Secondary | ICD-10-CM | POA: Diagnosis not present

## 2022-02-04 DIAGNOSIS — Z6822 Body mass index (BMI) 22.0-22.9, adult: Secondary | ICD-10-CM | POA: Diagnosis not present

## 2022-02-14 DIAGNOSIS — E039 Hypothyroidism, unspecified: Secondary | ICD-10-CM | POA: Diagnosis not present

## 2022-02-14 DIAGNOSIS — R002 Palpitations: Secondary | ICD-10-CM | POA: Diagnosis not present

## 2022-04-16 DIAGNOSIS — E039 Hypothyroidism, unspecified: Secondary | ICD-10-CM | POA: Diagnosis not present

## 2022-06-06 DIAGNOSIS — F321 Major depressive disorder, single episode, moderate: Secondary | ICD-10-CM | POA: Diagnosis not present

## 2022-06-06 DIAGNOSIS — Z6822 Body mass index (BMI) 22.0-22.9, adult: Secondary | ICD-10-CM | POA: Diagnosis not present

## 2022-06-06 DIAGNOSIS — R69 Illness, unspecified: Secondary | ICD-10-CM | POA: Diagnosis not present

## 2022-06-06 DIAGNOSIS — R002 Palpitations: Secondary | ICD-10-CM | POA: Diagnosis not present

## 2022-06-06 DIAGNOSIS — E039 Hypothyroidism, unspecified: Secondary | ICD-10-CM | POA: Diagnosis not present

## 2022-06-06 DIAGNOSIS — E7849 Other hyperlipidemia: Secondary | ICD-10-CM | POA: Diagnosis not present

## 2022-06-06 DIAGNOSIS — L821 Other seborrheic keratosis: Secondary | ICD-10-CM | POA: Diagnosis not present

## 2022-06-06 DIAGNOSIS — G4700 Insomnia, unspecified: Secondary | ICD-10-CM | POA: Diagnosis not present

## 2022-06-06 DIAGNOSIS — Z1331 Encounter for screening for depression: Secondary | ICD-10-CM | POA: Diagnosis not present

## 2022-06-06 DIAGNOSIS — E559 Vitamin D deficiency, unspecified: Secondary | ICD-10-CM | POA: Diagnosis not present

## 2022-06-06 DIAGNOSIS — E782 Mixed hyperlipidemia: Secondary | ICD-10-CM | POA: Diagnosis not present

## 2022-06-06 DIAGNOSIS — Z0001 Encounter for general adult medical examination with abnormal findings: Secondary | ICD-10-CM | POA: Diagnosis not present

## 2022-07-01 ENCOUNTER — Other Ambulatory Visit (HOSPITAL_COMMUNITY): Payer: Self-pay | Admitting: Family Medicine

## 2022-07-01 DIAGNOSIS — Z1231 Encounter for screening mammogram for malignant neoplasm of breast: Secondary | ICD-10-CM

## 2022-07-08 ENCOUNTER — Ambulatory Visit (HOSPITAL_COMMUNITY)
Admission: RE | Admit: 2022-07-08 | Discharge: 2022-07-08 | Disposition: A | Discharge status: Home / Self Care | Payer: Medicare HMO | Source: Ambulatory Visit | Attending: Family Medicine | Admitting: Family Medicine

## 2022-07-08 DIAGNOSIS — Z1231 Encounter for screening mammogram for malignant neoplasm of breast: Secondary | ICD-10-CM | POA: Diagnosis not present

## 2022-07-30 ENCOUNTER — Other Ambulatory Visit: Payer: Self-pay

## 2022-07-30 ENCOUNTER — Encounter (HOSPITAL_COMMUNITY): Payer: Self-pay | Admitting: Emergency Medicine

## 2022-07-30 DIAGNOSIS — Z5321 Procedure and treatment not carried out due to patient leaving prior to being seen by health care provider: Secondary | ICD-10-CM | POA: Insufficient documentation

## 2022-07-30 DIAGNOSIS — Z1152 Encounter for screening for COVID-19: Secondary | ICD-10-CM | POA: Insufficient documentation

## 2022-07-30 DIAGNOSIS — R5383 Other fatigue: Secondary | ICD-10-CM | POA: Insufficient documentation

## 2022-07-30 DIAGNOSIS — R519 Headache, unspecified: Secondary | ICD-10-CM | POA: Insufficient documentation

## 2022-07-30 LAB — RESP PANEL BY RT-PCR (RSV, FLU A&B, COVID)  RVPGX2
Influenza A by PCR: NEGATIVE
Influenza B by PCR: NEGATIVE
Resp Syncytial Virus by PCR: NEGATIVE
SARS Coronavirus 2 by RT PCR: NEGATIVE

## 2022-07-30 NOTE — ED Triage Notes (Signed)
Pt c/o fatigue and headache since yesterday. She states she started taking theraflu today and since she took med at 1600 she has felt jittery and worse.

## 2022-07-31 ENCOUNTER — Emergency Department (HOSPITAL_COMMUNITY)
Admission: EM | Admit: 2022-07-31 | Discharge: 2022-07-31 | Discharge status: Against Medical Advice | Payer: Medicare HMO | Attending: Emergency Medicine | Admitting: Emergency Medicine

## 2022-08-29 NOTE — Progress Notes (Signed)
Part of w/u for patient complaint

## 2022-09-25 ENCOUNTER — Encounter (INDEPENDENT_AMBULATORY_CARE_PROVIDER_SITE_OTHER): Payer: Self-pay | Admitting: *Deleted

## 2022-11-06 DIAGNOSIS — E559 Vitamin D deficiency, unspecified: Secondary | ICD-10-CM | POA: Diagnosis not present

## 2022-11-06 DIAGNOSIS — E782 Mixed hyperlipidemia: Secondary | ICD-10-CM | POA: Diagnosis not present

## 2022-11-06 DIAGNOSIS — E039 Hypothyroidism, unspecified: Secondary | ICD-10-CM | POA: Diagnosis not present

## 2022-11-06 DIAGNOSIS — G4700 Insomnia, unspecified: Secondary | ICD-10-CM | POA: Diagnosis not present

## 2022-11-06 DIAGNOSIS — E7849 Other hyperlipidemia: Secondary | ICD-10-CM | POA: Diagnosis not present

## 2022-12-27 DIAGNOSIS — L821 Other seborrheic keratosis: Secondary | ICD-10-CM | POA: Diagnosis not present

## 2022-12-27 DIAGNOSIS — M25511 Pain in right shoulder: Secondary | ICD-10-CM | POA: Diagnosis not present

## 2022-12-27 DIAGNOSIS — Z6822 Body mass index (BMI) 22.0-22.9, adult: Secondary | ICD-10-CM | POA: Diagnosis not present

## 2023-01-22 DIAGNOSIS — R21 Rash and other nonspecific skin eruption: Secondary | ICD-10-CM | POA: Diagnosis not present

## 2023-01-22 DIAGNOSIS — Z6823 Body mass index (BMI) 23.0-23.9, adult: Secondary | ICD-10-CM | POA: Diagnosis not present

## 2023-01-26 ENCOUNTER — Ambulatory Visit
Admission: EM | Admit: 2023-01-26 | Discharge: 2023-01-26 | Disposition: A | Discharge status: Home / Self Care | Payer: Medicare HMO

## 2023-01-26 DIAGNOSIS — R21 Rash and other nonspecific skin eruption: Secondary | ICD-10-CM | POA: Diagnosis not present

## 2023-01-26 MED ORDER — FAMOTIDINE 20 MG PO TABS: 20.0000 mg | ORAL_TABLET | Freq: Every day | ORAL | 0 refills | Status: AC

## 2023-01-26 MED ORDER — DEXAMETHASONE SODIUM PHOSPHATE 10 MG/ML IJ SOLN
10.0000 mg | INTRAMUSCULAR | Status: AC
  Administered 2023-01-26: 10 mg via INTRAMUSCULAR

## 2023-01-26 MED ORDER — HYDROXYZINE HCL 10 MG PO TABS: 10.0000 mg | ORAL_TABLET | Freq: Three times a day (TID) | ORAL | 0 refills | Status: AC | PRN

## 2023-01-26 NOTE — ED Triage Notes (Signed)
Pt presents with red itchy rash all over her body, worse on her back that she noticed Wednesday. Pt went Wednesday to her PCP and got prescribed prednisone and was gave a steroid shot but has not helped with rash. Pt states she thinks it may be a reaction to flexeril that she took Tuesday.

## 2023-01-26 NOTE — ED Provider Notes (Signed)
[facility]    CSN: [id number] Arrival date & time: 01/26/23  0827      History   Chief Complaint Chief Complaint  Patient presents with   Rash    HPI April Cobb is a 72 y.o. female.   The history is provided by the patient.   The patient presents for complaints of rash has been present for the past 3 to 4 days.  Patient states that she believes that she has had an allergic reaction to Flexeril, her last dose of Flexeril was 5 days ago, with the rash developing the next day.  Patient states she went to see her PCP who gave her a steroid shot and started her on prednisone.  The patient does not recall the name of the steroid injection that she was given at her doctor's office.  Patient states that since that time, the rash appears to be worsening.  She states the rash is all over her body, and she has extreme itching.  She denies shortness of breath, difficulty breathing, wheezing, chest pain, or chest tightness, exposure to new soaps, foods, detergents, or lotions.  Patient states that she has been prescribed Flexeril in the past, but her PCP always prescribes prednisone with it.  She is unsure if that is why she has not had a reaction to it in the past.  Patient states she has also been taking Benadryl with minimal relief.  Past Medical History:  Diagnosis Date   Hyperlipidemia    did not tolerate lipitor   Hypothyroidism    UTI (lower urinary tract infection)    frequent, [facility]    Patient Active Problem List   Diagnosis Date Noted   Hx of colonic polyps 07/02/2017   Family history of colon cancer 07/02/2017   FH: colon cancer 04/18/2011    Past Surgical History:  Procedure Laterality Date   COLONOSCOPY  06/2002   Dr. Shaune Pollack internal hemorrhoids.    COLONOSCOPY  05/03/2011   Procedure: COLONOSCOPY;  Surgeon: Arlyce Harman, MD;  Location: [facility];  Service: Endoscopy;  Laterality: N/A;  9:30   COLONOSCOPY N/A 10/16/2017   Procedure:  COLONOSCOPY;  Surgeon: Malissa Hippo, MD;  Location: [facility];  Service: Endoscopy;  Laterality: N/A;  830   HEMORRHOID SURGERY      OB History   No obstetric history on file.      Home Medications    Prior to Admission medications   Medication Sig Start Date End Date Taking? Authorizing Provider  acetaminophen (TYLENOL) 500 MG tablet Take 500 mg by mouth every 6 (six) hours as needed. For pain    Yes [provider]  buPROPion (WELLBUTRIN XL) 300 MG 24 hr tablet Take 300 mg by mouth daily. 11/08/21  Yes [provider]  ibuprofen (ADVIL,MOTRIN) 200 MG tablet Take 200 mg by mouth every 6 (six) hours as needed.   Yes [provider]  levothyroxine (SYNTHROID) 50 MCG tablet Take 50 mcg by mouth daily. 01/04/22  Yes [provider]  lovastatin (MEVACOR) 10 MG tablet Take 10 mg by mouth at bedtime.   Yes [provider]  predniSONE (DELTASONE) 10 MG tablet Take by mouth. 01/22/23  Yes [provider]  zolpidem (AMBIEN) 10 MG tablet Take 5 mg by mouth at bedtime as needed. 01/02/22  Yes [provider]    Family History Family History  Problem Relation Age of Onset   Colon polyps Mother  precancerous polyps   Diabetes Mother        deceased, alzheimers   Colon polyps Sister    Colon cancer Sister 11   Heart attack Father 49       deceased   Heart attack Sister        deceased, also with "blood disease". alzhemiers   Colon polyps Brother        precancerous polyps    Social History Social History   Tobacco Use   Smoking status: Former    Packs/day: .3    Types: Cigarettes   Smokeless tobacco: Never   Tobacco comments:    quit 2017  Vaping Use   Vaping Use: Never used  Substance Use Topics   Alcohol use: No   Drug use: No     Allergies   Codeine, Avelox [moxifloxacin hcl in nacl], Darvocet [propoxyphene n-acetaminophen], Lipitor [atorvastatin calcium], Zithromax [azithromycin dihydrate], and  Sulfa antibiotics   Review of Systems Review of Systems Per HPI  Physical Exam Triage Vital Signs ED Triage Vitals  Enc Vitals Group     BP 01/26/23 0841 (!) 173/97     Pulse Rate 01/26/23 0841 87     Resp 01/26/23 0841 16     Temp 01/26/23 0841 98.2 F (36.8 C)     Temp Source 01/26/23 0841 Oral     SpO2 01/26/23 0841 98 %     Weight --      Height --      Head Circumference --      Peak Flow --      Pain Score 01/26/23 0842 0     Pain Loc --      Pain Edu? --      Excl. in GC? --    No data found.  Updated Vital Signs BP (!) 161/64 (BP Location: Right Arm)   Pulse 87   Temp 98.2 F (36.8 C) (Oral)   Resp 16   SpO2 98%   Visual Acuity Right Eye Distance:   Left Eye Distance:   Bilateral Distance:    Right Eye Near:   Left Eye Near:    Bilateral Near:     Physical Exam Vitals and nursing note reviewed.  Constitutional:      General: She is in acute distress (Appears uncomfortable due to rash).     Appearance: Normal appearance.  HENT:     Head: Normocephalic.  Eyes:     Extraocular Movements: Extraocular movements intact.     Pupils: Pupils are equal, round, and reactive to light.  Cardiovascular:     Rate and Rhythm: Normal rate and regular rhythm.     Pulses: Normal pulses.     Heart sounds: Normal heart sounds.  Pulmonary:     Effort: Pulmonary effort is normal. No respiratory distress.     Breath sounds: Normal breath sounds. No stridor. No wheezing, rhonchi or rales.  Abdominal:     General: Bowel sounds are normal.     Palpations: Abdomen is soft.  Musculoskeletal:     Cervical back: Normal range of motion.  Skin:    General: Skin is warm and dry.     Findings: Rash present. Rash is urticarial.     Comments: Hives present to the abdomen, back, and chest.  Maculopapular rash noted to the bilateral upper extremities, and lower extremities.  Neurological:     General: No focal deficit present.     Mental Status: She is alert and oriented to  person, place, and time.  Psychiatric:        Mood and Affect: Mood normal.        Behavior: Behavior normal.      UC Treatments / Results  Labs (all labs ordered are listed, but only abnormal results are displayed) Labs Reviewed - No data to display  EKG   Radiology No results found.  Procedures Procedures (including critical care time)  Medications Ordered in UC Medications - No data to display  Initial Impression / Assessment and Plan / UC Course  I have reviewed the triage vital signs and the nursing notes.  Pertinent labs & imaging results that were available during my care of the patient were reviewed by me and considered in my medical decision making (see chart for details).  Patient presents for continued rash and possible medication reaction.  Difficult to determine the cause of the patient's rash.  On exam, she has moderate hives on the generalized body surface area.  The patient was seen by her PCP and was administered steroid injection and has been taking prednisone since that time.  Patient reports symptoms have worsened.  Decadron 10 mg IM was given today.  Patient was advised to skip the prednisone dose and resume her normal dosing on 6/24.  Patient was also advised to stop Benadryl, she was prescribed hydroxyzine 10 mg and Pepcid 20 mg for the rash.  Supportive care recommendations were provided and discussed with the patient to include avoiding hot baths or showers, applying cool compresses to the areas, and use of Aveeno colloidal oatmeal bath to help with itching.  Patient was given follow-up precautions.  Patient was in agreement with this plan of care and verbalized understanding.  All questions were answered.  Patient stable for discharge.  Final Clinical Impressions(s) / UC Diagnoses   Final diagnoses:  None   Discharge Instructions   None    ED Prescriptions   None    PDMP not reviewed this encounter.   Abran Cantor, NP 01/26/23  [phone]

## 2023-01-26 NOTE — Discharge Instructions (Signed)
You were given an injection of Decadron 10 mg.  Skip your dose of prednisone today and resume your normal dose on 01/23/2023. Take medication as prescribed.  Do not take Benadryl while you are taking the hydroxyzine prescribed today. Avoid hot baths or showers while symptoms persist.  Recommend taking lukewarm baths. May apply cool cloths to the area to help with itching or discomfort. Avoid scratching, rubbing, or manipulating the areas while symptoms persist. Recommend Aveeno Colloidal Oatmeal Bath to use to help with drying and itching.  You can purchase this over-the-counter at your local pharmacy. If symptoms do not improve with this treatment, please follow-up with your primary care physician for further evaluation. Follow-up as needed.

## 2023-01-30 DIAGNOSIS — L820 Inflamed seborrheic keratosis: Secondary | ICD-10-CM | POA: Diagnosis not present

## 2023-02-12 DIAGNOSIS — R21 Rash and other nonspecific skin eruption: Secondary | ICD-10-CM | POA: Diagnosis not present

## 2023-02-21 DIAGNOSIS — Z6823 Body mass index (BMI) 23.0-23.9, adult: Secondary | ICD-10-CM | POA: Diagnosis not present

## 2023-02-21 DIAGNOSIS — R21 Rash and other nonspecific skin eruption: Secondary | ICD-10-CM | POA: Diagnosis not present

## 2023-03-06 DIAGNOSIS — L820 Inflamed seborrheic keratosis: Secondary | ICD-10-CM | POA: Diagnosis not present

## 2023-04-10 DIAGNOSIS — L820 Inflamed seborrheic keratosis: Secondary | ICD-10-CM | POA: Diagnosis not present

## 2023-06-19 DIAGNOSIS — E559 Vitamin D deficiency, unspecified: Secondary | ICD-10-CM | POA: Diagnosis not present

## 2023-06-19 DIAGNOSIS — E039 Hypothyroidism, unspecified: Secondary | ICD-10-CM | POA: Diagnosis not present

## 2023-06-19 DIAGNOSIS — E7849 Other hyperlipidemia: Secondary | ICD-10-CM | POA: Diagnosis not present

## 2023-08-04 ENCOUNTER — Telehealth (INDEPENDENT_AMBULATORY_CARE_PROVIDER_SITE_OTHER): Payer: Self-pay | Admitting: Gastroenterology

## 2023-08-04 DIAGNOSIS — H524 Presbyopia: Secondary | ICD-10-CM | POA: Diagnosis not present

## 2023-08-04 NOTE — Telephone Encounter (Signed)
Who is your primary care physician: Assunta Found  Reasons for the colonoscopy: 5 year recall  Have you had a colonoscopy before?  Yes 10-12 years ago  Do you have family history of colon cancer? Yes sister  Previous colonoscopy with polyps removed? Yes 10-12 years ago  Do you have a history colorectal cancer?     Are you diabetic? If yes, Type 1 or Type 2?    no  Do you have a prosthetic or mechanical heart valve? no  Do you have a pacemaker/defibrillator?   no  Have you had endocarditis/atrial fibrillation? no  Have you had joint replacement within the last 12 months?  no  Do you tend to be constipated or have to use laxatives? yes  Do you have any history of drugs or alchohol?  no  Do you use supplemental oxygen?  no  Have you had a stroke or heart attack within the last 6 months? no  Do you take weight loss medication?  no  For female patients: have you had a hysterectomy?  no                                     are you post menopausal?       no                                            do you still have your menstrual cycle? no      Do you take any blood-thinning medications such as: (aspirin, warfarin, Plavix, Aggrenox)  no  If yes we need the name, milligram, dosage and who is prescribing doctor  Current Outpatient Medications on File Prior to Visit  Medication Sig Dispense Refill   acetaminophen (TYLENOL) 500 MG tablet Take 500 mg by mouth every 6 (six) hours as needed. For pain  (Patient not taking: Reported on 08/04/2023)     buPROPion (WELLBUTRIN XL) 300 MG 24 hr tablet Take 300 mg by mouth daily.     famotidine (PEPCID) 20 MG tablet Take 1 tablet (20 mg total) by mouth daily. (Patient not taking: Reported on 08/04/2023) 30 tablet 0   hydrOXYzine (ATARAX) 10 MG tablet Take 1 tablet (10 mg total) by mouth 3 (three) times daily as needed. (Patient not taking: Reported on 08/04/2023) 30 tablet 0   ibuprofen (ADVIL,MOTRIN) 200 MG tablet Take 200 mg by mouth every 6  (six) hours as needed. (Patient not taking: Reported on 08/04/2023)     levothyroxine (SYNTHROID) 50 MCG tablet Take 50 mcg by mouth daily.     lovastatin (MEVACOR) 10 MG tablet Take 10 mg by mouth at bedtime.     predniSONE (DELTASONE) 10 MG tablet Take by mouth. (Patient not taking: Reported on 08/04/2023)     zolpidem (AMBIEN) 10 MG tablet Take 5 mg by mouth at bedtime as needed. (Patient not taking: Reported on 08/04/2023)     No current facility-administered medications on file prior to visit.    Allergies  Allergen Reactions   Codeine Shortness Of Breath   Flexeril [Cyclobenzaprine] Hives   Avelox [Moxifloxacin Hcl In Nacl] Nausea And Vomiting   Darvocet [Propoxyphene N-Acetaminophen] Nausea And Vomiting   Lipitor [Atorvastatin Calcium]     Leg pain    Zithromax [Azithromycin Dihydrate] Nausea And Vomiting   Sulfa  Antibiotics Rash     Pharmacy: [facility]  Primary Insurance Name: [employer] number where you can be reached: [phone]

## 2023-08-04 NOTE — Telephone Encounter (Signed)
 Ok to schedule.  Room 1/2  Thanks,  Vista Lawman, MD Gastroenterology and Hepatology [facility]

## 2023-08-08 NOTE — Telephone Encounter (Signed)
 Left message to return call

## 2023-08-11 MED ORDER — PEG 3350-KCL-NA BICARB-NACL 420 G PO SOLR: 4000.0000 mL | Freq: Once | ORAL | 0 refills | Status: AC

## 2023-08-11 NOTE — Telephone Encounter (Signed)
 Pt left voicemail returning call. Returned call to patient. Pt scheduled for 09/02/23. Prep sent to pharmacy. Instructions mailed to pt

## 2023-08-11 NOTE — Telephone Encounter (Signed)
 Questionnaire from recall, no referral needed

## 2023-08-11 NOTE — Addendum Note (Signed)
 Addended by: Marlowe Shores on: 08/11/2023 01:29 PM   Modules accepted: Orders

## 2023-09-02 ENCOUNTER — Ambulatory Visit (HOSPITAL_COMMUNITY)
Admission: RE | Admit: 2023-09-02 | Discharge: 2023-09-02 | Disposition: A | Discharge status: Home / Self Care | Payer: Medicare HMO | Source: Ambulatory Visit | Attending: Gastroenterology | Admitting: Gastroenterology

## 2023-09-02 ENCOUNTER — Encounter (HOSPITAL_COMMUNITY)
Admission: RE | Disposition: A | Discharge status: Home / Self Care | Payer: Self-pay | Source: Ambulatory Visit | Attending: Gastroenterology

## 2023-09-02 ENCOUNTER — Encounter (HOSPITAL_COMMUNITY): Payer: Self-pay | Admitting: Gastroenterology

## 2023-09-02 ENCOUNTER — Ambulatory Visit (HOSPITAL_COMMUNITY): Payer: Self-pay | Admitting: Anesthesiology

## 2023-09-02 ENCOUNTER — Ambulatory Visit (HOSPITAL_BASED_OUTPATIENT_CLINIC_OR_DEPARTMENT_OTHER): Payer: Medicare HMO | Admitting: Anesthesiology

## 2023-09-02 ENCOUNTER — Other Ambulatory Visit: Payer: Self-pay

## 2023-09-02 DIAGNOSIS — K648 Other hemorrhoids: Secondary | ICD-10-CM

## 2023-09-02 DIAGNOSIS — D122 Benign neoplasm of ascending colon: Secondary | ICD-10-CM | POA: Diagnosis not present

## 2023-09-02 DIAGNOSIS — Q438 Other specified congenital malformations of intestine: Secondary | ICD-10-CM | POA: Diagnosis not present

## 2023-09-02 DIAGNOSIS — Z1211 Encounter for screening for malignant neoplasm of colon: Secondary | ICD-10-CM

## 2023-09-02 DIAGNOSIS — K219 Gastro-esophageal reflux disease without esophagitis: Secondary | ICD-10-CM | POA: Diagnosis not present

## 2023-09-02 DIAGNOSIS — D125 Benign neoplasm of sigmoid colon: Secondary | ICD-10-CM

## 2023-09-02 DIAGNOSIS — K5730 Diverticulosis of large intestine without perforation or abscess without bleeding: Secondary | ICD-10-CM

## 2023-09-02 DIAGNOSIS — E039 Hypothyroidism, unspecified: Secondary | ICD-10-CM | POA: Insufficient documentation

## 2023-09-02 DIAGNOSIS — Z800 Family history of malignant neoplasm of digestive organs: Secondary | ICD-10-CM | POA: Diagnosis not present

## 2023-09-02 DIAGNOSIS — Z87891 Personal history of nicotine dependence: Secondary | ICD-10-CM | POA: Insufficient documentation

## 2023-09-02 DIAGNOSIS — K5790 Diverticulosis of intestine, part unspecified, without perforation or abscess without bleeding: Secondary | ICD-10-CM | POA: Insufficient documentation

## 2023-09-02 DIAGNOSIS — Z860100 Personal history of colon polyps, unspecified: Secondary | ICD-10-CM

## 2023-09-02 DIAGNOSIS — Z807 Family history of other malignant neoplasms of lymphoid, hematopoietic and related tissues: Secondary | ICD-10-CM | POA: Diagnosis not present

## 2023-09-02 DIAGNOSIS — F32A Depression, unspecified: Secondary | ICD-10-CM | POA: Insufficient documentation

## 2023-09-02 DIAGNOSIS — K644 Residual hemorrhoidal skin tags: Secondary | ICD-10-CM | POA: Diagnosis not present

## 2023-09-02 HISTORY — PX: COLONOSCOPY WITH PROPOFOL: SHX5780

## 2023-09-02 HISTORY — DX: Gastro-esophageal reflux disease without esophagitis: K21.9

## 2023-09-02 HISTORY — PX: POLYPECTOMY: SHX5525

## 2023-09-02 HISTORY — DX: Depression, unspecified: F32.A

## 2023-09-02 LAB — HM COLONOSCOPY

## 2023-09-02 SURGERY — COLONOSCOPY WITH PROPOFOL
Anesthesia: General

## 2023-09-02 MED ORDER — ONDANSETRON HCL 4 MG/2ML IJ SOLN
INTRAMUSCULAR | Status: AC
  Filled 2023-09-02: qty 2

## 2023-09-02 MED ORDER — ONDANSETRON HCL 4 MG/2ML IJ SOLN
INTRAMUSCULAR | Status: DC | PRN
  Administered 2023-09-02: 4 mg via INTRAVENOUS

## 2023-09-02 MED ORDER — LACTATED RINGERS IV SOLN: INTRAVENOUS | Status: DC

## 2023-09-02 MED ORDER — PROPOFOL 500 MG/50ML IV EMUL
INTRAVENOUS | Status: DC | PRN
  Administered 2023-09-02: 150 ug/kg/min via INTRAVENOUS

## 2023-09-02 MED ORDER — LIDOCAINE HCL (PF) 2 % IJ SOLN
INTRAMUSCULAR | Status: DC | PRN
  Administered 2023-09-02: 50 mg via INTRADERMAL

## 2023-09-02 MED ORDER — PROPOFOL 10 MG/ML IV BOLUS
INTRAVENOUS | Status: DC | PRN
  Administered 2023-09-02 (×2): 50 mg via INTRAVENOUS
  Administered 2023-09-02: 100 mg via INTRAVENOUS

## 2023-09-02 NOTE — Anesthesia Preprocedure Evaluation (Signed)
Anesthesia Evaluation  Patient identified by MRN, date of birth, ID band Patient awake    Reviewed: Allergy & Precautions, H&P , NPO status , Patient's Chart, lab work & pertinent test results, reviewed documented beta blocker date and time   Airway Mallampati: II  TM Distance: >3 FB Neck ROM: full    Dental no notable dental hx.    Pulmonary neg pulmonary ROS, former smoker   Pulmonary exam normal breath sounds clear to auscultation       Cardiovascular Exercise Tolerance: Good hypertension, negative cardio ROS  Rhythm:regular Rate:Normal     Neuro/Psych  PSYCHIATRIC DISORDERS  Depression    negative neurological ROS  negative psych ROS   GI/Hepatic negative GI ROS, Neg liver ROS,GERD  ,,  Endo/Other  negative endocrine ROSHypothyroidism    Renal/GU negative Renal ROS  negative genitourinary   Musculoskeletal   Abdominal   Peds  Hematology negative hematology ROS (+)   Anesthesia Other Findings   Reproductive/Obstetrics negative OB ROS                             Anesthesia Physical Anesthesia Plan  ASA: 2  Anesthesia Plan: General   Post-op Pain Management:    Induction:   PONV Risk Score and Plan: Propofol infusion  Airway Management Planned:   Additional Equipment:   Intra-op Plan:   Post-operative Plan:   Informed Consent: I have reviewed the patients History and Physical, chart, labs and discussed the procedure including the risks, benefits and alternatives for the proposed anesthesia with the patient or authorized representative who has indicated his/her understanding and acceptance.     Dental Advisory Given  Plan Discussed with: CRNA  Anesthesia Plan Comments:        Anesthesia Quick Evaluation

## 2023-09-02 NOTE — Transfer of Care (Signed)
Immediate Anesthesia Transfer of Care Note  Patient: April Cobb  Procedure(s) Performed: COLONOSCOPY WITH PROPOFOL POLYPECTOMY  Patient Location: Endoscopy Unit  Anesthesia Type:General  Level of Consciousness: drowsy  Airway & Oxygen Therapy: Patient Spontanous Breathing  Post-op Assessment: Report given to RN and Post -op Vital signs reviewed and stable  Post vital signs: Reviewed and stable  Last Vitals:  Vitals Value Taken Time  BP    Temp    Pulse    Resp    SpO2      Last Pain:  Vitals:   09/02/23 0802  TempSrc:   PainSc: 0-No pain      Patients Stated Pain Goal: 8 (09/02/23 0745)  Complications: No notable events documented.

## 2023-09-02 NOTE — Discharge Instructions (Addendum)

## 2023-09-02 NOTE — H&P (Signed)
Primary Care Physician:  Assunta Found, MD Primary Gastroenterologist:  Dr. Tasia Catchings  Pre-Procedure History & Physical: HPI:  April Cobb is a 73 y.o. female with history of colonic adenoma and is here for surveillance colonoscopy  No melena or hematochezia.  No abdominal pain or unintentional weight loss.  No change in bowel habits.  Overall feels well from a GI standpoint.Family history significant for CRC and a sister who was 4 at the time of diagnosis and is doing 5 years later. Another sister has been treated for gastric lymphoma and is in remission. 2019  - The entire examined colon is normal. - External hemorrhoids. Repeat 5 years  Past Medical History:  Diagnosis Date   Depression    GERD (gastroesophageal reflux disease)    Hyperlipidemia    did not tolerate lipitor   Hypothyroidism    UTI (lower urinary tract infection)    frequent, [facility]    Past Surgical History:  Procedure Laterality Date   COLONOSCOPY  06/2002   Dr. Shaune Pollack internal hemorrhoids.    COLONOSCOPY  05/03/2011   Procedure: COLONOSCOPY;  Surgeon: Arlyce Harman, MD;  Location: [facility];  Service: Endoscopy;  Laterality: N/A;  9:30   COLONOSCOPY N/A 10/16/2017   Procedure: COLONOSCOPY;  Surgeon: Malissa Hippo, MD;  Location: [facility];  Service: Endoscopy;  Laterality: N/A;  830   HEMORRHOID SURGERY      Prior to Admission medications   Medication Sig Start Date End Date Taking? Authorizing Provider  acetaminophen (TYLENOL) 500 MG tablet Take 500 mg by mouth every 6 (six) hours as needed. For pain   Yes [provider]  buPROPion (WELLBUTRIN XL) 300 MG 24 hr tablet Take 300 mg by mouth daily. 11/08/21  Yes [provider]  levothyroxine (SYNTHROID) 50 MCG tablet Take 50 mcg by mouth daily. 01/04/22  Yes [provider]  lovastatin (MEVACOR) 10 MG tablet Take 10 mg by mouth at bedtime.   Yes [provider]  omeprazole (PRILOSEC) 20 MG capsule Take  20 mg by mouth daily.   Yes [provider]  zolpidem (AMBIEN) 10 MG tablet Take 5 mg by mouth at bedtime as needed. 01/02/22  Yes [provider]  famotidine (PEPCID) 20 MG tablet Take 1 tablet (20 mg total) by mouth daily. Patient not taking: Reported on 08/04/2023 01/26/23   Leath-Warren, Sadie Haber, NP  hydrOXYzine (ATARAX) 10 MG tablet Take 1 tablet (10 mg total) by mouth 3 (three) times daily as needed. Patient not taking: Reported on 08/04/2023 01/26/23   Leath-Warren, Sadie Haber, NP  ibuprofen (ADVIL,MOTRIN) 200 MG tablet Take 200 mg by mouth every 6 (six) hours as needed. Patient not taking: Reported on 08/04/2023    [provider]  predniSONE (DELTASONE) 10 MG tablet Take by mouth. Patient not taking: Reported on 08/04/2023 01/22/23   [provider]    Allergies as of 08/11/2023 - Review Complete 08/04/2023  Allergen Reaction Noted   Codeine Shortness Of Breath 04/18/2011   Flexeril [cyclobenzaprine] Hives 01/26/2023   Avelox [moxifloxacin hcl in nacl] Nausea And Vomiting 04/18/2011   Darvocet [propoxyphene n-acetaminophen] Nausea And Vomiting 04/18/2011   Lipitor [atorvastatin calcium]  04/18/2011   Zithromax [azithromycin dihydrate] Nausea And Vomiting 04/18/2011   Sulfa antibiotics Rash 04/18/2011    Family History  Problem Relation Age of Onset   Colon polyps Mother        precancerous polyps   Diabetes Mother  deceased, alzheimers   Colon polyps Sister    Colon cancer Sister 99   Heart attack Father 5       deceased   Heart attack Sister        deceased, also with "blood disease". alzhemiers   Colon polyps Brother        precancerous polyps    Social History   Socioeconomic History   Marital status: Married    Spouse name: Not on file   Number of children: 0   Years of education: Not on file   Highest education level: Not on file  Occupational History   Occupation: retired    Associate Professor: retired  Tobacco Use    Smoking status: Former    Current packs/day: 0.30    Types: Cigarettes   Smokeless tobacco: Never   Tobacco comments:    quit 2017  Vaping Use   Vaping status: Never Used  Substance and Sexual Activity   Alcohol use: No   Drug use: No   Sexual activity: Not on file  Other Topics Concern   Not on file  Social History Narrative   Not on file   Social Drivers of Health   Financial Resource Strain: Not on file  Food Insecurity: Not on file  Transportation Needs: Not on file  Physical Activity: Not on file  Stress: Not on file  Social Connections: Not on file  Intimate Partner Violence: Not on file    Review of Systems: See HPI, otherwise negative ROS  Physical Exam: Vital signs in last 24 hours: Temp:  [97.9 F (36.6 C)] 97.9 F (36.6 C) (01/28 0745) Pulse Rate:  [66] 66 (01/28 0745) Resp:  [15] 15 (01/28 0745) BP: (136)/(70) 136/70 (01/28 0745) SpO2:  [98 %] 98 % (01/28 0745) Weight:  [56.2 kg] 56.2 kg (01/28 0745)   General:   Alert,  Well-developed, well-nourished, pleasant and cooperative in NAD Head:  Normocephalic and atraumatic. Eyes:  Sclera clear, no icterus.   Conjunctiva pink. Ears:  Normal auditory acuity. Nose:  No deformity, discharge,  or lesions. Msk:  Symmetrical without gross deformities. Normal posture. Extremities:  Without clubbing or edema. Neurologic:  Alert and  oriented x4;  grossly normal neurologically. Skin:  Intact without significant lesions or rashes. Psych:  Alert and cooperative. Normal mood and affect.  Impression/Plan: April Cobb is a 73 y.o. female with history of colonic adenoma and is here for surveillance colonoscopy   The risks of the procedure including infection, bleed, or perforation as well as benefits, limitations, alternatives and imponderables have been reviewed with the patient. Questions have been answered. All parties agreeable.

## 2023-09-02 NOTE — Anesthesia Procedure Notes (Signed)
Date/Time: 09/02/2023 8:01 AM  Performed by: Julian Reil, CRNAPre-anesthesia Checklist: Patient identified, Emergency Drugs available, Suction available and Patient being monitored Patient Re-evaluated:Patient Re-evaluated prior to induction Oxygen Delivery Method: Nasal cannula Induction Type: IV induction Placement Confirmation: positive ETCO2

## 2023-09-02 NOTE — Op Note (Signed)
[facility] Patient Name: April Cobb Procedure Date: 09/02/2023 7:54 AM MRN: [id number] Date of Birth: 1951/03/08 Attending MD: Sanjuan Dame , MD, [id number] CSN: [id number] Age: 73 Admit Type: Outpatient Procedure:                Colonoscopy Indications:              High risk colon cancer surveillance: Personal                            history of colonic polyps, Family history of colon                            cancer in a first-degree relative before age 18                            years Providers:                Sanjuan Dame, MD, Sheran Fava, Dyann Ruddle Referring MD:              Medicines:                Monitored Anesthesia Care Complications:            No immediate complications. Estimated Blood Loss:     Estimated blood loss: none. Procedure:                Pre-Anesthesia Assessment:                           - Prior to the procedure, a History and Physical                            was performed, and patient medications and                            allergies were reviewed. The patient's tolerance of                            previous anesthesia was also reviewed. The risks                            and benefits of the procedure and the sedation                            options and risks were discussed with the patient.                            All questions were answered, and informed consent                            was obtained. Prior Anticoagulants: The patient has                            taken no anticoagulant or antiplatelet agents. ASA  Grade Assessment: II - A patient with mild systemic                            disease. After reviewing the risks and benefits,                            the patient was deemed in satisfactory condition to                            undergo the procedure.                           After obtaining informed consent, the colonoscope                            was passed under  direct vision. Throughout the                            procedure, the patient's blood pressure, pulse, and                            oxygen saturations were monitored continuously. The                            [id number]) scope was introduced through the                            anus and advanced to the the cecum, identified by                            appendiceal orifice and ileocecal valve. The                            colonoscopy was performed without difficulty. The                            patient tolerated the procedure well. The quality                            of the bowel preparation was evaluated using the                            BBPS [facility] Bowel Preparation Scale) with scores                            of: Right Colon = 2 (minor amount of residual                            staining, small fragments of stool and/or opaque                            liquid, but mucosa seen well), Transverse Colon = 2                            (  minor amount of residual staining, small fragments                            of stool and/or opaque liquid, but mucosa seen                            well) and Left Colon = 2 (minor amount of residual                            staining, small fragments of stool and/or opaque                            liquid, but mucosa seen well). The total BBPS score                            equals 6. The ileocecal valve, appendiceal orifice,                            and rectum were photographed. Scope In: 8:07:11 AM Scope Out: 8:42:49 AM Scope Withdrawal Time: 0 hours 21 minutes 50 seconds  Total Procedure Duration: 0 hours 35 minutes 38 seconds  Findings:      The perianal and digital rectal examinations were normal.      Two sessile polyps were found in the sigmoid colon and ascending colon.       The polyps were 2 to 4 mm in size. These polyps were removed with a cold       snare. Resection and retrieval were complete.       Scattered diverticula were found in the left colon.      The left colon was moderately tortuous. Advancing the scope required       applying abdominal pressure.      Non-bleeding external and internal hemorrhoids were found during       retroflexion. The hemorrhoids were moderate.      A moderate amount of stool was found in the sigmoid colon, interfering       with visualization. Lavage of the area was performed using a moderate       amount of sterile water, resulting in clearance with good visualization. Impression:               - Two 2 to 4 mm polyps in the sigmoid colon and in                            the ascending colon, removed with a cold snare.                            Resected and retrieved.                           - Diverticulosis in the left colon.                           - Tortuous colon.                           -  Non-bleeding external and internal hemorrhoids.                           - Stool in the sigmoid colon. Moderate Sedation:      Per Anesthesia Care Recommendation:           - Patient has a contact number available for                            emergencies. The signs and symptoms of potential                            delayed complications were discussed with the                            patient. Return to normal activities tomorrow.                            Written discharge instructions were provided to the                            patient.                           - Resume previous diet.                           - Continue present medications.                           - Await pathology results.                           - Repeat colonoscopy in 5 years for surveillance.                           - Return to primary care physician as previously                            scheduled. Procedure Code(s):        --- Professional ---                           [phone], Colonoscopy, flexible; with removal of                            tumor(s),  polyp(s), or other lesion(s) by snare                            technique Diagnosis Code(s):        --- Professional ---                           Z86.010, Personal history of colonic polyps                           D12.5, Benign neoplasm of sigmoid colon  D12.2, Benign neoplasm of ascending colon                           K64.8, Other hemorrhoids                           Z80.0, Family history of malignant neoplasm of                            digestive organs                           K57.30, Diverticulosis of large intestine without                            perforation or abscess without bleeding                           Q43.8, Other specified congenital malformations of                            intestine CPT copyright 2022 American Medical Association. All rights reserved. The codes documented in this report are preliminary and upon coder review may  be revised to meet current compliance requirements. Sanjuan Dame, MD Sanjuan Dame, MD 09/02/2023 8:48:46 AM This report has been signed electronically. Number of Addenda: 0

## 2023-09-03 ENCOUNTER — Encounter (HOSPITAL_COMMUNITY): Payer: Self-pay | Admitting: Gastroenterology

## 2023-09-03 ENCOUNTER — Encounter (INDEPENDENT_AMBULATORY_CARE_PROVIDER_SITE_OTHER): Payer: Self-pay | Admitting: *Deleted

## 2023-09-03 LAB — SURGICAL PATHOLOGY

## 2023-09-03 NOTE — Anesthesia Postprocedure Evaluation (Signed)
Anesthesia Post Note  Patient: April Cobb  Procedure(s) Performed: COLONOSCOPY WITH PROPOFOL POLYPECTOMY  Patient location during evaluation: Phase II Anesthesia Type: General Level of consciousness: awake Pain management: pain level controlled Vital Signs Assessment: post-procedure vital signs reviewed and stable Respiratory status: spontaneous breathing and respiratory function stable Cardiovascular status: blood pressure returned to baseline and stable Postop Assessment: no headache and no apparent nausea or vomiting Anesthetic complications: no Comments: Late entry   No notable events documented.   Last Vitals:  Vitals:   09/02/23 0900 09/02/23 0912  BP: 135/77 132/65  Pulse: 67 67  Resp:    Temp:    SpO2: 100% 100%    Last Pain:  Vitals:   09/02/23 0912  TempSrc:   PainSc: 3                  [name]

## 2023-09-08 NOTE — Progress Notes (Signed)
I reviewed the pathology results. Ann, can you send her a letter with the findings as described below please?  Repeat colonoscopy in 5 years  Thanks,  April Lawman, MD Gastroenterology and Hepatology [facility]  ---------------------------------------------------------------------------------------------  [facility] [address], [place], [place] [address] Phone:  [phone]   09/08/23 [place], [place]   Dear April Cobb,  I am writing to let you know the results of your recent colonoscopy.  You had a total of 2 polyps removed. The pathology came back as "tubular adenoma." These findings are NOT cancer, but had the polyps remained in your colon, they could have turned into cancer.  Given these findings, it is recommended that your next colonoscopy be performed in 5 years but  As per [place]  Multi-Society Task Force on Colorectal Cancer recommendation; For individuals ages 48 to 25, the decision to start or continue screening should be individualized.   Also I value your feedback , so if you get a survey , please take the time to fill it out and thank you for choosing [facility]  Please call us at [phone] if you have persistent problems or have questions about your condition that have not been fully answered at this time.  Sincerely,  April Lawman, MD Gastroenterology and Hepatology

## 2023-09-09 ENCOUNTER — Encounter (INDEPENDENT_AMBULATORY_CARE_PROVIDER_SITE_OTHER): Payer: Self-pay | Admitting: *Deleted

## 2023-11-24 ENCOUNTER — Ambulatory Visit
Admission: EM | Admit: 2023-11-24 | Discharge: 2023-11-24 | Disposition: A | Discharge status: Home / Self Care | Attending: Nurse Practitioner | Admitting: Nurse Practitioner

## 2023-11-24 DIAGNOSIS — Z889 Allergy status to unspecified drugs, medicaments and biological substances status: Secondary | ICD-10-CM | POA: Diagnosis not present

## 2023-11-24 DIAGNOSIS — J069 Acute upper respiratory infection, unspecified: Secondary | ICD-10-CM | POA: Diagnosis not present

## 2023-11-24 LAB — POC SARS CORONAVIRUS 2 AG -  ED: SARS Coronavirus 2 Ag: NEGATIVE

## 2023-11-24 MED ORDER — FLUTICASONE PROPIONATE 50 MCG/ACT NA SUSP: 2.0000 | Freq: Every day | NASAL | 0 refills | Status: AC

## 2023-11-24 MED ORDER — PSEUDOEPH-BROMPHEN-DM 30-2-10 MG/5ML PO SYRP: 5.0000 mL | ORAL_SOLUTION | Freq: Four times a day (QID) | ORAL | 0 refills | Status: AC | PRN

## 2023-11-24 MED ORDER — CETIRIZINE HCL 10 MG PO TABS: 10.0000 mg | ORAL_TABLET | Freq: Every day | ORAL | 0 refills | Status: AC

## 2023-11-24 NOTE — Discharge Instructions (Signed)
 The COVID test was negative. Take medication as prescribed.  Discontinue regular use of Benadryl.  Recommend that you take Claritin, Allegra, or Zyrtec  daily for your allergies. May use over-the-counter Tylenol as needed for pain, fever, or general discomfort. Also recommend the use of normal saline nasal spray throughout the day for nasal congestion and runny nose. For your cough, it will be helpful for to use a humidifier at nighttime during sleep and to sleep elevated on pillows while your cough symptoms persist. Please be advised that your cough may last from days to weeks.  If you are generally feeling well but continued to have a persistent nagging cough, you may continue over-the-counter throat lozenges or cough medications.  If you develop new symptoms of fever, chills, wheezing, or other concerns, recommend immediate follow-up for further evaluation. Follow-up as needed.

## 2023-11-24 NOTE — ED Triage Notes (Signed)
 Pt reports cough,congestion, headache onset Friday.

## 2023-11-24 NOTE — ED Provider Notes (Addendum)
 [facility]    CSN: [id number] Arrival date & time: 11/24/23  0807      History   Chief Complaint No chief complaint on file.   HPI April Cobb is a 73 y.o. female.   The history is provided by the patient.   Patient presents for complaints of nasal congestion, runny nose, headache, and cough that has been present for the past several days.  Patient denies fever, chills, ear pain, ear drainage, wheezing, difficulty breathing, chest pain, abdominal pain, nausea, vomiting, diarrhea, or rash.  Patient reports that she has been taking Benadryl, Tylenol, and Mucinex.  She denies any obvious known sick contacts.  Past Medical History:  Diagnosis Date   Depression    GERD (gastroesophageal reflux disease)    Hyperlipidemia    did not tolerate lipitor   Hypothyroidism    UTI (lower urinary tract infection)    frequent, [facility]    Patient Active Problem List   Diagnosis Date Noted   Adenomatous polyp of ascending colon 09/02/2023   Hx of colonic polyps 07/02/2017   Family history of colon cancer 07/02/2017   FH: colon cancer 04/18/2011    Past Surgical History:  Procedure Laterality Date   COLONOSCOPY  06/2002   Dr. Martina Sledge internal hemorrhoids.    COLONOSCOPY  05/03/2011   Procedure: COLONOSCOPY;  Surgeon: Pauleen Borne, MD;  [facility];  Service: Endoscopy;  Laterality: N/A;  9:30   COLONOSCOPY N/A 10/16/2017   Procedure: COLONOSCOPY;  Surgeon: Ruby Corporal, MD;  [facility]: [facility];  Service: Endoscopy;  Laterality: N/A;  830   COLONOSCOPY WITH PROPOFOL  N/A 09/02/2023   Procedure: COLONOSCOPY WITH PROPOFOL ;  Surgeon: Hargis Lias, MD;  Location: [facility];  Service: Endoscopy;  Laterality: N/A;  9:00AM;ASA 1-2   HEMORRHOID SURGERY     POLYPECTOMY  09/02/2023   Procedure: POLYPECTOMY;  Surgeon: Hargis Lias, MD;  Location: [facility];  Service: Endoscopy;;    OB History   No obstetric history on file.       Home Medications    Prior to Admission medications   Medication Sig Start Date End Date Taking? Authorizing Provider  brompheniramine-pseudoephedrine-DM 30-2-10 MG/5ML syrup Take 5 mLs by mouth 4 (four) times daily as needed. 11/24/23  Yes Leath-Warren, Belen Bowers, NP  cetirizine  (ZYRTEC ) 10 MG tablet Take 1 tablet (10 mg total) by mouth daily. 11/24/23  Yes Leath-Warren, Belen Bowers, NP  fluticasone  (FLONASE ) 50 MCG/ACT nasal spray Place 2 sprays into both nostrils daily. 11/24/23  Yes Leath-Warren, Belen Bowers, NP  acetaminophen (TYLENOL) 500 MG tablet Take 500 mg by mouth every 6 (six) hours as needed. For pain    [provider]  buPROPion (WELLBUTRIN XL) 300 MG 24 hr tablet Take 300 mg by mouth daily. 11/08/21   [provider]  famotidine  (PEPCID ) 20 MG tablet Take 1 tablet (20 mg total) by mouth daily. Patient not taking: Reported on 08/04/2023 01/26/23   Leath-Warren, Belen Bowers, NP  hydrOXYzine  (ATARAX ) 10 MG tablet Take 1 tablet (10 mg total) by mouth 3 (three) times daily as needed. Patient not taking: Reported on 08/04/2023 01/26/23   Leath-Warren, Belen Bowers, NP  ibuprofen (ADVIL,MOTRIN) 200 MG tablet Take 200 mg by mouth every 6 (six) hours as needed. Patient not taking: Reported on 08/04/2023    [provider]  levothyroxine (SYNTHROID) 50 MCG tablet Take 50 mcg by mouth daily. 01/04/22   [provider]  lovastatin (  MEVACOR) 10 MG tablet Take 10 mg by mouth at bedtime.    [provider]  omeprazole (PRILOSEC) 20 MG capsule Take 20 mg by mouth daily.    [provider]  predniSONE (DELTASONE) 10 MG tablet Take by mouth. Patient not taking: Reported on 08/04/2023 01/22/23   [provider]  zolpidem (AMBIEN) 10 MG tablet Take 5 mg by mouth at bedtime as needed. 01/02/22   [provider]    Family History Family History  Problem Relation Age of Onset   Colon polyps Mother        precancerous polyps    Diabetes Mother        deceased, alzheimers   Colon polyps Sister    Colon cancer Sister 27   Heart attack Father 61       deceased   Heart attack Sister        deceased, also with "blood disease". alzhemiers   Colon polyps Brother        precancerous polyps    Social History Social History   Tobacco Use   Smoking status: Former    Current packs/day: 0.30    Types: Cigarettes   Smokeless tobacco: Never   Tobacco comments:    quit 2017  Vaping Use   Vaping status: Never Used  Substance Use Topics   Alcohol use: No   Drug use: No     Allergies   Codeine, Flexeril [cyclobenzaprine], Avelox [moxifloxacin hcl in nacl], Darvocet [propoxyphene n-acetaminophen], Lipitor [atorvastatin calcium], Zithromax [azithromycin dihydrate], and Sulfa antibiotics   Review of Systems Review of Systems Per HPI  Physical Exam Triage Vital Signs ED Triage Vitals  Encounter Vitals Group     BP 11/24/23 0923 131/74     Systolic BP Percentile --      Diastolic BP Percentile --      Pulse Rate 11/24/23 0923 80     Resp 11/24/23 0923 18     Temp 11/24/23 0923 98.4 F (36.9 C)     Temp Source 11/24/23 0923 Oral     SpO2 11/24/23 0923 95 %     Weight --      Height --      Head Circumference --      Peak Flow --      Pain Score 11/24/23 0925 0     Pain Loc --      Pain Education --      Exclude from Growth Chart --    No data found.  Updated Vital Signs BP 131/74 (BP Location: Right Arm)   Pulse 80   Temp 98.4 F (36.9 C) (Oral)   Resp 18   SpO2 95%   Visual Acuity Right Eye Distance:   Left Eye Distance:   Bilateral Distance:    Right Eye Near:   Left Eye Near:    Bilateral Near:     Physical Exam Vitals and nursing note reviewed.  Constitutional:      General: She is not in acute distress.    Appearance: Normal appearance.  HENT:     Head: Normocephalic.     Right Ear: Tympanic membrane, ear canal and external ear normal.     Left Ear: Tympanic membrane, ear  canal and external ear normal.     Nose: Congestion present.     Mouth/Throat:     Mouth: Mucous membranes are moist.     Pharynx: No posterior oropharyngeal erythema.     Comments: Cobblestoning present to posterior  oropharynx  Eyes:     Extraocular Movements: Extraocular movements intact.     Conjunctiva/sclera: Conjunctivae normal.     Pupils: Pupils are equal, round, and reactive to light.  Cardiovascular:     Rate and Rhythm: Normal rate and regular rhythm.     Pulses: Normal pulses.     Heart sounds: Normal heart sounds.  Pulmonary:     Effort: Pulmonary effort is normal.     Breath sounds: Normal breath sounds.  Abdominal:     General: Bowel sounds are normal.     Palpations: Abdomen is soft.     Tenderness: There is no abdominal tenderness.  Musculoskeletal:     Cervical back: Normal range of motion.  Lymphadenopathy:     Cervical: No cervical adenopathy.  Skin:    General: Skin is warm and dry.  Neurological:     General: No focal deficit present.     Mental Status: She is alert and oriented to person, place, and time.  Psychiatric:        Mood and Affect: Mood normal.        Behavior: Behavior normal.      UC Treatments / Results  Labs (all labs ordered are listed, but only abnormal results are displayed) Labs Reviewed  POC SARS CORONAVIRUS 2 AG -  ED - Normal    EKG   Radiology No results found.  Procedures Procedures (including critical care time)  Medications Ordered in UC Medications - No data to display  Initial Impression / Assessment and Plan / UC Course  I have reviewed the triage vital signs and the nursing notes.  Pertinent labs & imaging results that were available during my care of the patient were reviewed by me and considered in my medical decision making (see chart for details).  The COVID test was negative.  Patient with underlying history of seasonal allergies, suspect patient may have a viral URI with cough and ongoing seasonal  allergies.  Will provide symptomatic treatment with Bromfed-DM for the cough, cetirizine  10 mg as an antihistamine, and fluticasone  50 mcg nasal spray for nasal congestion and runny nose.  Supportive care recommendations were provided and discussed with the patient to include over-the-counter Tylenol, fluids, rest, normal saline nasal spray, use of a humidifier during sleep.  Advised patient to discontinue regular use of Benadryl.  Patient was given strict follow-up precautions.  Patient was in agreement with this plan of care and verbalizes understanding.  All questions were answered.  Patient stable for discharge.  Final Clinical Impressions(s) / UC Diagnoses   Final diagnoses:  Viral URI with cough  History of seasonal allergies     Discharge Instructions      The COVID test was negative. Take medication as prescribed.  Discontinue regular use of Benadryl.  Recommend that you take Claritin, Allegra, or Zyrtec  daily for your allergies. May use over-the-counter Tylenol as needed for pain, fever, or general discomfort. Also recommend the use of normal saline nasal spray throughout the day for nasal congestion and runny nose. For your cough, it will be helpful for to use a humidifier at nighttime during sleep and to sleep elevated on pillows while your cough symptoms persist. Please be advised that your cough may last from days to weeks.  If you are generally feeling well but continued to have a persistent nagging cough, you may continue over-the-counter throat lozenges or cough medications.  If you develop new symptoms of fever, chills, wheezing, or other concerns, recommend immediate  follow-up for further evaluation. Follow-up as needed.      ED Prescriptions     Medication Sig Dispense Auth. Provider   cetirizine  (ZYRTEC ) 10 MG tablet Take 1 tablet (10 mg total) by mouth daily. 30 tablet Leath-Warren, Belen Bowers, NP   brompheniramine-pseudoephedrine-DM 30-2-10 MG/5ML syrup Take 5 mLs  by mouth 4 (four) times daily as needed. 140 mL Leath-Warren, Belen Bowers, NP   fluticasone  (FLONASE ) 50 MCG/ACT nasal spray Place 2 sprays into both nostrils daily. 16 g Leath-Warren, Belen Bowers, NP      PDMP not reviewed this encounter.   Hardy Lia, NP 11/24/23 1002    Leath-Warren, Belen Bowers, NP 11/24/23 2027

## 2024-02-19 DIAGNOSIS — G4700 Insomnia, unspecified: Secondary | ICD-10-CM | POA: Diagnosis not present

## 2024-02-19 DIAGNOSIS — Z6823 Body mass index (BMI) 23.0-23.9, adult: Secondary | ICD-10-CM | POA: Diagnosis not present

## 2024-02-19 DIAGNOSIS — M7551 Bursitis of right shoulder: Secondary | ICD-10-CM | POA: Diagnosis not present

## 2024-05-20 DIAGNOSIS — Z79890 Hormone replacement therapy (postmenopausal): Secondary | ICD-10-CM | POA: Diagnosis not present

## 2024-05-20 DIAGNOSIS — Z79899 Other long term (current) drug therapy: Secondary | ICD-10-CM | POA: Diagnosis not present

## 2024-05-20 DIAGNOSIS — E039 Hypothyroidism, unspecified: Secondary | ICD-10-CM | POA: Diagnosis not present

## 2024-05-20 DIAGNOSIS — Z8249 Family history of ischemic heart disease and other diseases of the circulatory system: Secondary | ICD-10-CM | POA: Diagnosis not present

## 2024-05-20 DIAGNOSIS — Z833 Family history of diabetes mellitus: Secondary | ICD-10-CM | POA: Diagnosis not present

## 2024-05-20 DIAGNOSIS — F32A Depression, unspecified: Secondary | ICD-10-CM | POA: Diagnosis not present

## 2024-05-20 DIAGNOSIS — F5104 Psychophysiologic insomnia: Secondary | ICD-10-CM | POA: Diagnosis not present

## 2024-05-20 DIAGNOSIS — Z7689 Persons encountering health services in other specified circumstances: Secondary | ICD-10-CM | POA: Diagnosis not present

## 2024-05-20 DIAGNOSIS — E782 Mixed hyperlipidemia: Secondary | ICD-10-CM | POA: Diagnosis not present

## 2024-05-20 DIAGNOSIS — R7989 Other specified abnormal findings of blood chemistry: Secondary | ICD-10-CM | POA: Diagnosis not present

## 2024-05-20 DIAGNOSIS — Z87891 Personal history of nicotine dependence: Secondary | ICD-10-CM | POA: Diagnosis not present

## 2024-06-25 DIAGNOSIS — R7303 Prediabetes: Secondary | ICD-10-CM | POA: Diagnosis not present

## 2024-06-25 DIAGNOSIS — Z0000 Encounter for general adult medical examination without abnormal findings: Secondary | ICD-10-CM | POA: Diagnosis not present

## 2024-06-25 DIAGNOSIS — E782 Mixed hyperlipidemia: Secondary | ICD-10-CM | POA: Diagnosis not present

## 2024-06-25 DIAGNOSIS — Z87891 Personal history of nicotine dependence: Secondary | ICD-10-CM | POA: Diagnosis not present

## 2024-06-25 DIAGNOSIS — R7989 Other specified abnormal findings of blood chemistry: Secondary | ICD-10-CM | POA: Diagnosis not present

## 2024-06-25 DIAGNOSIS — E039 Hypothyroidism, unspecified: Secondary | ICD-10-CM | POA: Diagnosis not present

## 2024-06-25 DIAGNOSIS — F5104 Psychophysiologic insomnia: Secondary | ICD-10-CM | POA: Diagnosis not present

## 2024-06-25 DIAGNOSIS — F32A Depression, unspecified: Secondary | ICD-10-CM | POA: Diagnosis not present
